# Patient Record
Sex: Male | Born: 2000 | Race: White | Hispanic: No | Marital: Single | Smoking: Current every day smoker
Health system: Southern US, Community
[De-identification: ages and names within clinical notes are randomized; demographics above are authoritative.]

## PROBLEM LIST (undated history)

## (undated) DIAGNOSIS — E079 Disorder of thyroid, unspecified: Secondary | ICD-10-CM

## (undated) DIAGNOSIS — T7840XA Allergy, unspecified, initial encounter: Secondary | ICD-10-CM

## (undated) HISTORY — PX: TONSILLECTOMY: SUR1361

---
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
## 2019-01-15 ENCOUNTER — Other Ambulatory Visit: Payer: Self-pay | Admitting: Pediatrics

## 2019-01-15 DIAGNOSIS — E039 Hypothyroidism, unspecified: Secondary | ICD-10-CM

## 2019-01-19 ENCOUNTER — Telehealth: Payer: Self-pay

## 2019-01-19 NOTE — Telephone Encounter (Signed)
Pre-screening for in-office visit   1. Who is bringing the patient to the visit?   Mother-mom aware of one person policy.   2. Has the person bringing the patient or the patient traveled outside of the state in the past 14 days?   no  3. Has the person bringing the patient or the patient had contact with anyone with suspected or confirmed COVID-19 in the last 14 days?  no   4. Has the person bringing the patient or the patient had any of these symptoms in the last 14 days?   No symptoms reported.   Fever (temp 100.4 F or higher) Difficulty breathing Cough   Made mom aware that masks are required to enter the building. If they do not have one [facility] will provide one for them. 

## 2019-01-20 ENCOUNTER — Ambulatory Visit (INDEPENDENT_AMBULATORY_CARE_PROVIDER_SITE_OTHER): Payer: Medicaid Other

## 2019-01-20 ENCOUNTER — Other Ambulatory Visit: Payer: Self-pay

## 2019-01-20 DIAGNOSIS — E349 Endocrine disorder, unspecified: Secondary | ICD-10-CM

## 2019-01-20 DIAGNOSIS — F640 Transsexualism: Secondary | ICD-10-CM

## 2019-01-20 DIAGNOSIS — Z3042 Encounter for surveillance of injectable contraceptive: Secondary | ICD-10-CM

## 2019-01-20 DIAGNOSIS — IMO0001 Reserved for inherently not codable concepts without codable children: Secondary | ICD-10-CM

## 2019-01-20 MED ORDER — MEDROXYPROGESTERONE ACETATE 150 MG/ML IM SUSP
150.0000 mg | Freq: Once | INTRAMUSCULAR | Status: AC
  Administered 2019-01-20: 150 mg via INTRAMUSCULAR

## 2019-01-20 NOTE — Progress Notes (Signed)
Pt presents for depo injection. Pt within depo window, no urine hcg needed. Injection given, tolerated well. F/u depo injection visit scheduled.   

## 2019-01-31 NOTE — Progress Notes (Signed)
Pt is transgender. Changed diagnosis code to z32.42. please let me know if you have problems with this code.Thanks!

## 2019-02-05 ENCOUNTER — Other Ambulatory Visit: Payer: Self-pay | Admitting: Pediatrics

## 2019-02-05 DIAGNOSIS — E039 Hypothyroidism, unspecified: Secondary | ICD-10-CM

## 2019-02-09 ENCOUNTER — Telehealth: Payer: Self-pay

## 2019-02-09 NOTE — Telephone Encounter (Signed)
Pt called to cancel. Due to college courses- he plans to find out when his fall break is- and schedule onsite nex insertion for that week.

## 2019-02-10 ENCOUNTER — Ambulatory Visit: Payer: Medicaid Other | Admitting: Family

## 2019-03-31 ENCOUNTER — Ambulatory Visit: Payer: Medicaid Other | Admitting: Pediatrics
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
## 2019-06-27 ENCOUNTER — Ambulatory Visit (INDEPENDENT_AMBULATORY_CARE_PROVIDER_SITE_OTHER): Payer: Medicaid Other

## 2019-06-27 ENCOUNTER — Other Ambulatory Visit: Payer: Self-pay

## 2019-06-27 DIAGNOSIS — F640 Transsexualism: Secondary | ICD-10-CM | POA: Diagnosis not present

## 2019-06-27 DIAGNOSIS — Z3202 Encounter for pregnancy test, result negative: Secondary | ICD-10-CM

## 2019-06-27 DIAGNOSIS — Z3042 Encounter for surveillance of injectable contraceptive: Secondary | ICD-10-CM

## 2019-06-27 LAB — POCT URINE PREGNANCY: Preg Test, Ur: NEGATIVE

## 2019-06-27 MED ORDER — MEDROXYPROGESTERONE ACETATE 150 MG/ML IM SUSP
150.0000 mg | Freq: Once | INTRAMUSCULAR | Status: AC
  Administered 2019-06-27: 150 mg via INTRAMUSCULAR

## 2019-06-27 NOTE — Progress Notes (Signed)
Pt presents for depo injection. Pt within depo window, urine hcg needed- urine preg negative. Injection given, tolerated well. F/u depo injection visit scheduled.

## 2019-07-04 ENCOUNTER — Other Ambulatory Visit: Payer: Self-pay | Admitting: Family

## 2019-07-04 DIAGNOSIS — F5101 Primary insomnia: Secondary | ICD-10-CM

## 2019-07-05 MED ORDER — TRAZODONE HCL 100 MG PO TABS: 100.0000 mg | ORAL_TABLET | Freq: Every day | ORAL | 0 refills | Status: DC

## 2019-07-12 ENCOUNTER — Ambulatory Visit: Payer: Medicaid Other | Attending: Internal Medicine

## 2019-07-12 DIAGNOSIS — Z20822 Contact with and (suspected) exposure to covid-19: Secondary | ICD-10-CM

## 2019-07-13 LAB — NOVEL CORONAVIRUS, NAA: SARS-CoV-2, NAA: DETECTED — AB

## 2019-08-04 ENCOUNTER — Encounter: Payer: Self-pay | Admitting: Family

## 2019-09-12 ENCOUNTER — Ambulatory Visit: Payer: Medicaid Other

## 2019-09-19 ENCOUNTER — Ambulatory Visit: Payer: Medicaid Other

## 2019-10-25 ENCOUNTER — Other Ambulatory Visit: Payer: Self-pay | Admitting: Pediatrics

## 2019-10-25 DIAGNOSIS — F640 Transsexualism: Secondary | ICD-10-CM

## 2019-10-26 ENCOUNTER — Encounter: Payer: Self-pay | Admitting: Family

## 2019-11-03 ENCOUNTER — Other Ambulatory Visit (INDEPENDENT_AMBULATORY_CARE_PROVIDER_SITE_OTHER): Payer: Medicaid Other

## 2019-11-03 ENCOUNTER — Other Ambulatory Visit: Payer: Self-pay

## 2019-11-03 DIAGNOSIS — Z3202 Encounter for pregnancy test, result negative: Secondary | ICD-10-CM

## 2019-11-03 DIAGNOSIS — Z3049 Encounter for surveillance of other contraceptives: Secondary | ICD-10-CM | POA: Diagnosis not present

## 2019-11-03 DIAGNOSIS — Z3042 Encounter for surveillance of injectable contraceptive: Secondary | ICD-10-CM

## 2019-11-03 DIAGNOSIS — F640 Transsexualism: Secondary | ICD-10-CM | POA: Diagnosis not present

## 2019-11-03 LAB — POCT URINE PREGNANCY: Preg Test, Ur: NEGATIVE

## 2019-11-03 MED ORDER — MEDROXYPROGESTERONE ACETATE 150 MG/ML IM SUSP
150.0000 mg | Freq: Once | INTRAMUSCULAR | Status: AC
  Administered 2019-11-03: 150 mg via INTRAMUSCULAR

## 2019-11-03 MED ORDER — MEDROXYPROGESTERONE ACETATE 150 MG/ML IM SUSP: 150.0000 mg | Freq: Once | INTRAMUSCULAR | Status: DC

## 2019-11-04 NOTE — Progress Notes (Signed)
Urine preg POC done and Depo given at time of visit.

## 2019-11-12 ENCOUNTER — Encounter: Payer: Self-pay | Admitting: Family

## 2019-11-15 ENCOUNTER — Other Ambulatory Visit: Payer: Self-pay | Admitting: Family

## 2019-11-15 DIAGNOSIS — F640 Transsexualism: Secondary | ICD-10-CM

## 2019-11-15 DIAGNOSIS — Z789 Other specified health status: Secondary | ICD-10-CM

## 2019-11-15 MED ORDER — "SYRINGE 25G X 5/8"" 3 ML MISC": 6 refills | Status: DC

## 2019-11-15 MED ORDER — "BD LUER-LOK SYRINGE 25G X 1-1/2"" 3 ML MISC": 1 refills | Status: DC

## 2019-11-21 ENCOUNTER — Encounter: Payer: Self-pay | Admitting: Family

## 2019-12-01 ENCOUNTER — Ambulatory Visit (INDEPENDENT_AMBULATORY_CARE_PROVIDER_SITE_OTHER): Payer: Medicaid Other | Admitting: Pediatric Endocrinology

## 2019-12-01 ENCOUNTER — Encounter (INDEPENDENT_AMBULATORY_CARE_PROVIDER_SITE_OTHER): Payer: Self-pay

## 2020-06-29 ENCOUNTER — Telehealth: Payer: Medicaid Other | Admitting: Family

## 2020-06-29 ENCOUNTER — Encounter: Payer: Self-pay | Admitting: Family

## 2020-06-29 ENCOUNTER — Other Ambulatory Visit: Payer: Self-pay | Admitting: Family

## 2020-06-29 DIAGNOSIS — Z789 Other specified health status: Secondary | ICD-10-CM

## 2020-07-10 ENCOUNTER — Ambulatory Visit (INDEPENDENT_AMBULATORY_CARE_PROVIDER_SITE_OTHER): Payer: Medicaid Other | Admitting: Pediatrics

## 2020-07-10 ENCOUNTER — Encounter: Payer: Self-pay | Admitting: Pediatrics

## 2020-07-10 VITALS — BP 116/67 | HR 70 | Ht 68.6 in | Wt 284.5 lb

## 2020-07-10 DIAGNOSIS — E039 Hypothyroidism, unspecified: Secondary | ICD-10-CM | POA: Diagnosis not present

## 2020-07-10 DIAGNOSIS — Z789 Other specified health status: Secondary | ICD-10-CM | POA: Diagnosis not present

## 2020-07-10 DIAGNOSIS — F4310 Post-traumatic stress disorder, unspecified: Secondary | ICD-10-CM | POA: Diagnosis not present

## 2020-07-10 MED ORDER — TESTOSTERONE CYPIONATE 200 MG/ML IM SOLN: INTRAMUSCULAR | 0 refills | Status: DC

## 2020-07-10 MED ORDER — "BD LUER-LOK SYRINGE 25G X 1-1/2"" 3 ML MISC": 1 refills | Status: DC

## 2020-07-10 MED ORDER — SERTRALINE HCL 50 MG PO TABS: 50.0000 mg | ORAL_TABLET | Freq: Every day | ORAL | 3 refills | Status: DC

## 2020-07-10 MED ORDER — "SYRINGE 25G X 5/8"" 3 ML MISC": 6 refills | Status: DC

## 2020-07-10 MED ORDER — LEVOTHYROXINE SODIUM 150 MCG PO TABS: 150.0000 ug | ORAL_TABLET | Freq: Every day | ORAL | 2 refills | Status: DC
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
## 2020-07-10 NOTE — Patient Instructions (Signed)
[facility]-  [phone]  Continue testosterone  Restart zolfot 50 mg daily  Restart synthroid 150 mcg daily  Labs today

## 2020-07-11 LAB — COMPREHENSIVE METABOLIC PANEL
AG Ratio: 1.7 (calc) (ref 1.0–2.5)
ALT: 35 U/L (ref 9–46)
AST: 27 U/L (ref 10–40)
Albumin: 4.8 g/dL (ref 3.6–5.1)
Alkaline phosphatase (APISO): 152 U/L — ABNORMAL HIGH (ref 36–130)
BUN: 11 mg/dL (ref 7–25)
CO2: 24 mmol/L (ref 20–32)
Calcium: 10.3 mg/dL (ref 8.6–10.3)
Chloride: 104 mmol/L (ref 98–110)
Creat: 0.9 mg/dL (ref 0.60–1.35)
Globulin: 2.8 g/dL (calc) (ref 1.9–3.7)
Glucose, Bld: 84 mg/dL (ref 65–99)
Potassium: 4.7 mmol/L (ref 3.5–5.3)
Sodium: 140 mmol/L (ref 135–146)
Total Bilirubin: 0.5 mg/dL (ref 0.2–1.2)
Total Protein: 7.6 g/dL (ref 6.1–8.1)

## 2020-07-11 LAB — LIPID PANEL
Cholesterol: 160 mg/dL (ref <200)
HDL: 42 mg/dL (ref >=40)
LDL Cholesterol (Calc): 104 mg/dL (calc) — ABNORMAL HIGH
Non-HDL Cholesterol (Calc): 118 mg/dL (calc) (ref <130)
Total CHOL/HDL Ratio: 3.8 (calc) (ref <5.0)
Triglycerides: 62 mg/dL (ref <150)

## 2020-07-11 LAB — TESTOSTERONE: Testosterone: 186 ng/dL — ABNORMAL LOW (ref 250–827)

## 2020-07-11 LAB — HEMOGLOBIN AND HEMATOCRIT, BLOOD
HCT: 53.5 % — ABNORMAL HIGH (ref 38.5–50.0)
Hemoglobin: 17.6 g/dL — ABNORMAL HIGH (ref 13.2–17.1)

## 2020-07-11 LAB — HEMOGLOBIN A1C
Hgb A1c MFr Bld: 5.2 % of total Hgb (ref <5.7)
Mean Plasma Glucose: 103 mg/dL
eAG (mmol/L): 5.7 mmol/L

## 2020-07-11 LAB — TSH+FREE T4: TSH W/REFLEX TO FT4: 5.53 mIU/L — ABNORMAL HIGH (ref 0.40–4.50)

## 2020-07-11 LAB — T4, FREE: Free T4: 1.3 ng/dL (ref 0.8–1.4)

## 2020-07-31 ENCOUNTER — Other Ambulatory Visit: Payer: Self-pay

## 2020-07-31 ENCOUNTER — Encounter: Payer: Self-pay | Admitting: Pediatrics

## 2020-07-31 ENCOUNTER — Ambulatory Visit (INDEPENDENT_AMBULATORY_CARE_PROVIDER_SITE_OTHER): Payer: Medicaid Other | Admitting: Pediatrics

## 2020-07-31 VITALS — BP 134/82 | HR 86 | Ht 68.6 in | Wt 283.4 lb

## 2020-07-31 DIAGNOSIS — F4310 Post-traumatic stress disorder, unspecified: Secondary | ICD-10-CM

## 2020-07-31 DIAGNOSIS — R030 Elevated blood-pressure reading, without diagnosis of hypertension: Secondary | ICD-10-CM

## 2020-07-31 DIAGNOSIS — E039 Hypothyroidism, unspecified: Secondary | ICD-10-CM

## 2020-07-31 DIAGNOSIS — Z789 Other specified health status: Secondary | ICD-10-CM

## 2020-07-31 NOTE — Progress Notes (Signed)
History was provided by the patient.  Jesse Luna is a 20 y.o. adult who is here for gender dysphoria, hypothyroidism, .  No primary care provider on file.   HPI:  Pt reports that vaginal bleeding stopped about 3 days into synthroid. No concerns today.   Mood is not as bad as it was.   Back on testosterone at previous dosing.   Moved to [place] now which is exciting.   Taking 100 mg of testosterone every week.   No LMP recorded. Patient has had an injection.  Review of Systems  Constitutional: Negative for malaise/fatigue.  HENT: Positive for congestion.   Eyes: Negative for double vision.  Respiratory: Positive for cough. Negative for shortness of breath.   Cardiovascular: Negative for chest pain and palpitations.  Gastrointestinal: Negative for abdominal pain, constipation, diarrhea, nausea and vomiting.  Genitourinary: Negative for dysuria.  Musculoskeletal: Negative for joint pain and myalgias.  Skin: Negative for rash.  Neurological: Negative for dizziness and headaches.  Endo/Heme/Allergies: Does not bruise/bleed easily.  Psychiatric/Behavioral: Negative for depression. The patient is nervous/anxious.     Patient Active Problem List   Diagnosis Date Noted  . Elevated BP without diagnosis of hypertension 07/31/2020  . History of peanut allergy 07/07/2017  . Hypothyroidism 07/07/2017  . 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 Post traumatic stress disorder (PTSD) 06/14/2017    Current Outpatient Medications on File Prior to Visit  Medication Sig Dispense Refill  . BD HYPODERMIC NEEDLE 25G X 1-1/2" MISC USE FOR TESTOSTERONE INJECTION ONCE A WEEK 90 each 11  . levothyroxine (SYNTHROID) 150 MCG tablet Take 1 tablet (150 mcg total) by mouth daily. 90 tablet 2  . sertraline (ZOLOFT) 50 MG tablet Take 1 tablet (50 mg total) by mouth daily. 30 tablet 3  . SYRINGE-NEEDLE, DISP, 3 ML (B-D 3CC LUER-LOK SYR 25GX1/2") 25G X 1-1/2" 3 ML MISC USE FOR TESTOSTERONE  INJECTION ONCE A WEEK 30 each 1  . Syringe/Needle, Disp, (SYRINGE 3CC/25GX5/8") 25G X 5/8" 3 ML MISC Use once a week for testosterone injections 100 each 6  . testosterone cypionate (DEPOTESTOSTERONE CYPIONATE) 200 MG/ML injection INJECT 100 MG (0.5 ML) INTO THE SUBCUTANEOUS TISSUE EVERY 7 DAYS. 10 mL 0   No current facility-administered medications on file prior to visit.    Allergies  Allergen Reactions  . Peanut-Containing Drug Products Anaphylaxis    Physical Exam:    Vitals:   07/31/20 1111 07/31/20 1113 07/31/20 1148  BP: (!) 140/96 (!) 138/95 134/82  Pulse: 86 (!) 101 86  Weight: 283 lb 6.4 oz (128.5 kg)    Height: 5' 8.6" (1.742 m)      Growth percentile SmartLinks can only be used for patients less than 49 years old.  Physical Exam Constitutional:      Appearance: He is well-developed and well-nourished.  HENT:     Head: Normocephalic.  Neck:     Thyroid: No thyromegaly.  Cardiovascular:     Rate and Rhythm: Normal rate and regular rhythm.     Pulses: Intact distal pulses.     Heart sounds: Normal heart sounds.  Pulmonary:     Effort: Pulmonary effort is normal.     Breath sounds: Normal breath sounds.  Abdominal:     General: Bowel sounds are normal.     Palpations: Abdomen is soft.     Tenderness: There is no abdominal tenderness.  Musculoskeletal:        General: Normal range of motion.  Skin:    General:  Skin is warm and dry.  Neurological:     Mental Status: He is alert and oriented to person, place, and time.  Psychiatric:        Mood and Affect: Mood and affect normal.     Assessment/Plan: 1. Male-to-male transgender person Will repeat testosterone today now that he is back on the 100 mg. H&H were elevated on last check, so want to ensure his T level is not too high. Based solely on clinical observation, I suspect we may need to decrease his dose.  - Testosterone  2. Post traumatic stress disorder (PTSD) Stable with restart of sertraline.    3. Hypothyroidism, unspecified type Repeat TSH today- he has been back on maintenance dose for 3 weeks.  - TSH  4. Elevated BP without diagnosis of hypertension Improved prior to leaving clinic today. He would benefit from lifestyle changes. He was going to job interview in [place] after our visit which would have him doing more physical activity. Will continue to monitor for now, needs PCP.   Return in 6 weeks or sooner as needed.   [name], FNP

## 2020-08-01 LAB — TESTOSTERONE: Testosterone: 357 ng/dL (ref 250–827)

## 2020-08-01 LAB — TSH: TSH: 1.1 mIU/L (ref 0.40–4.50)

## 2020-09-11 ENCOUNTER — Ambulatory Visit: Payer: Medicaid Other | Admitting: Pediatrics

## 2020-10-31 ENCOUNTER — Other Ambulatory Visit: Payer: Self-pay | Admitting: Pediatrics

## 2020-10-31 DIAGNOSIS — F4310 Post-traumatic stress disorder, unspecified: Secondary | ICD-10-CM

## 2020-11-16 ENCOUNTER — Other Ambulatory Visit: Payer: Self-pay

## 2020-11-16 ENCOUNTER — Emergency Department (HOSPITAL_BASED_OUTPATIENT_CLINIC_OR_DEPARTMENT_OTHER)
Admission: EM | Admit: 2020-11-16 | Discharge: 2020-11-16 | Disposition: A | Discharge status: Home / Self Care | Payer: Worker's Compensation | Attending: Emergency Medicine | Admitting: Emergency Medicine

## 2020-11-16 ENCOUNTER — Encounter (HOSPITAL_BASED_OUTPATIENT_CLINIC_OR_DEPARTMENT_OTHER): Payer: Self-pay

## 2020-11-16 DIAGNOSIS — W228XXA Striking against or struck by other objects, initial encounter: Secondary | ICD-10-CM | POA: Insufficient documentation

## 2020-11-16 DIAGNOSIS — S0502XA Injury of conjunctiva and corneal abrasion without foreign body, left eye, initial encounter: Secondary | ICD-10-CM | POA: Diagnosis not present

## 2020-11-16 DIAGNOSIS — S0592XA Unspecified injury of left eye and orbit, initial encounter: Secondary | ICD-10-CM | POA: Diagnosis present

## 2020-11-16 DIAGNOSIS — Z23 Encounter for immunization: Secondary | ICD-10-CM | POA: Insufficient documentation

## 2020-11-16 DIAGNOSIS — F17290 Nicotine dependence, other tobacco product, uncomplicated: Secondary | ICD-10-CM | POA: Insufficient documentation

## 2020-11-16 DIAGNOSIS — Z91010 Allergy to peanuts: Secondary | ICD-10-CM | POA: Diagnosis not present

## 2020-11-16 DIAGNOSIS — Y990 Civilian activity done for income or pay: Secondary | ICD-10-CM | POA: Insufficient documentation

## 2020-11-16 DIAGNOSIS — Z79899 Other long term (current) drug therapy: Secondary | ICD-10-CM | POA: Insufficient documentation

## 2020-11-16 DIAGNOSIS — E039 Hypothyroidism, unspecified: Secondary | ICD-10-CM | POA: Diagnosis not present

## 2020-11-16 MED ORDER — TETRACAINE HCL 0.5 % OP SOLN
2.0000 [drp] | Freq: Once | OPHTHALMIC | Status: AC
  Administered 2020-11-16: 2 [drp] via OPHTHALMIC
  Filled 2020-11-16: qty 4

## 2020-11-16 MED ORDER — ERYTHROMYCIN 5 MG/GM OP OINT: TOPICAL_OINTMENT | OPHTHALMIC | 0 refills | Status: DC

## 2020-11-16 MED ORDER — OXYCODONE-ACETAMINOPHEN 5-325 MG PO TABS: 1.0000 | ORAL_TABLET | Freq: Every evening | ORAL | 0 refills | Status: DC | PRN

## 2020-11-16 MED ORDER — TETANUS-DIPHTH-ACELL PERTUSSIS 5-2.5-18.5 LF-MCG/0.5 IM SUSY
0.5000 mL | PREFILLED_SYRINGE | Freq: Once | INTRAMUSCULAR | Status: AC
  Administered 2020-11-16: 0.5 mL via INTRAMUSCULAR
  Filled 2020-11-16: qty 0.5

## 2020-11-16 MED ORDER — FLUORESCEIN SODIUM 1 MG OP STRP
2.0000 | ORAL_STRIP | Freq: Once | OPHTHALMIC | Status: AC
  Administered 2020-11-16: 2 via OPHTHALMIC
  Filled 2020-11-16: qty 2

## 2020-11-16 MED ORDER — ERYTHROMYCIN 5 MG/GM OP OINT
TOPICAL_OINTMENT | Freq: Once | OPHTHALMIC | Status: AC
  Administered 2020-11-16: 1 via OPHTHALMIC
  Filled 2020-11-16: qty 3.5

## 2020-11-16 NOTE — ED Triage Notes (Addendum)
Pt reports ?FB left eye while cutting plywood ~5 hours PTA-states eye has been flushed-NAD-steady gait

## 2020-11-16 NOTE — Discharge Instructions (Signed)
To use your eye ointment please place half an inch in the left lower eyelid 3 times a day for 5 days. I have given you a paper prescription for the same medicine that you got here. You only need to get this filled if you use up the medicine you got here and are not done with your course of antibiotics.  Please take Ibuprofen (Advil, motrin) and Tylenol (acetaminophen) to relieve your pain.    You may take up to 600 MG (3 pills) of normal strength ibuprofen every 8 hours as needed.   You make take tylenol, up to 1,000 mg (two extra strength pills) every 8 hours as needed.   It is safe to take ibuprofen and tylenol at the same time as they work differently.   Do not take more than 3,000 mg tylenol in a 24 hour period (not more than one dose every 8 hours.  Please check all medication labels as many medications such as pain and cold medications may contain tylenol.  Do not drink alcohol while taking these medications.  Do not take other NSAID'S while taking ibuprofen (such as aleve or naproxen).  Please take ibuprofen with food to decrease stomach upset.  You are being prescribed a medication which may make you sleepy. For 24 hours after one dose please do not drive, operate heavy machinery, care for a small child with out another adult present, or perform any activities that may cause harm to you or someone else if you were to fall asleep or be impaired.

## 2020-11-16 NOTE — ED Provider Notes (Signed)
[facility] EMERGENCY DEPARTMENT Provider Note   CSN: [id number] Arrival date & time: 11/16/20  1815     History Chief Complaint  Patient presents with  . Foreign Body in Jesse Luna is a 20 y.o. adult who presents today for concern of left eye foreign body. He states that at about 10 AM this morning, by his gas, he was cutting plywood at work using a Paramedic saw.  He states that a piece of the plywood flew up over his glasses and went into his left eye.  He irrigated the eye prior to arrival.  His pain is been worsening.  He denies any other injuries.  He does not know when his last tetanus shot was, chart review shows that it was an January 2012.    HPI     Past Medical History:  Diagnosis Date  . Allergy   . Thyroid disease     Patient Active Problem List   Diagnosis Date Noted  . Elevated BP without diagnosis of hypertension 07/31/2020  . History of peanut allergy 07/07/2017  . Hypothyroidism 07/07/2017  . 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 Post traumatic stress disorder (PTSD) 06/14/2017    Past Surgical History:  Procedure Laterality Date  . TONSILLECTOMY       OB History   No obstetric history on file.     Family History  Problem Relation Age of Onset  . Alcohol abuse Mother   . Alcohol abuse Father     Social History   Tobacco Use  . Smoking status: Current Every Day Smoker    Types: E-cigarettes  . Smokeless tobacco: Never Used  Vaping Use  . Vaping Use: Every day  Substance Use Topics  . Alcohol use: Yes    Comment: occ  . Drug use: Yes    Types: Marijuana    Home Medications Prior to Admission medications   Medication Sig Start Date End Date Taking? Authorizing Provider  erythromycin ophthalmic ointment Place a 1/2 inch ribbon of ointment into the left lower eyelid three times a day for 5 days 11/16/20  Yes Cristina Gong, PA-C  oxyCODONE-acetaminophen (PERCOCET/ROXICET) 5-325 MG  tablet Take 1 tablet by mouth at bedtime as needed for severe pain. 11/16/20  Yes Cristina Gong, PA-C  BD HYPODERMIC NEEDLE 25G X 1-1/2" MISC USE FOR TESTOSTERONE INJECTION ONCE A WEEK 10/03/18   [name] T, FNP  levothyroxine (SYNTHROID) 150 MCG tablet Take 1 tablet (150 mcg total) by mouth daily. 07/10/20   Verneda Skill, FNP  sertraline (ZOLOFT) 50 MG tablet TAKE 1 TABLET BY MOUTH EVERY DAY 10/31/20   [name] T, FNP  SYRINGE-NEEDLE, DISP, 3 ML (B-D 3CC LUER-LOK SYR 25GX1/2") 25G X 1-1/2" 3 ML MISC USE FOR TESTOSTERONE INJECTION ONCE A WEEK 07/10/20   [name] T, FNP  Syringe/Needle, Disp, (SYRINGE 3CC/25GX5/8") 25G X 5/8" 3 ML MISC Use once a week for testosterone injections 07/10/20   [name] T, FNP  testosterone cypionate (DEPOTESTOSTERONE CYPIONATE) 200 MG/ML injection INJECT 100 MG (0.5 ML) INTO THE SUBCUTANEOUS TISSUE EVERY 7 DAYS. 07/10/20   Verneda Skill, FNP    Allergies    Peanut-containing drug products  Review of Systems   Review of Systems  Constitutional: Negative for chills and fever.  Eyes: Positive for photophobia, pain, discharge, redness and visual disturbance.       All left eye only  Cardiovascular: Negative for chest pain.  Gastrointestinal: Negative for  abdominal pain.  Neurological: Negative for weakness and headaches.  All other systems reviewed and are negative.   Physical Exam Updated Vital Signs BP 129/79 (BP Location: Left Arm)   Pulse 70   Temp 98.4 F (36.9 C) (Oral)   Resp 18   Ht 5\' 9"  (1.753 m)   Wt 125.2 kg   SpO2 98%   BMI 40.76 kg/m   Physical Exam Vitals and nursing note reviewed.  Constitutional:      General: He is not in acute distress.    Appearance: He is not ill-appearing.  HENT:     Head: Normocephalic.  Eyes:     General: Lids are everted, no foreign bodies appreciated. Gaze aligned appropriately. No visual field deficit.       Right eye: No foreign body.        Left eye: No  foreign body.     Extraocular Movements: Extraocular movements intact.     Conjunctiva/sclera:     Right eye: Right conjunctiva is not injected. No chemosis, exudate or hemorrhage.    Left eye: Left conjunctiva is injected. No chemosis, exudate or hemorrhage.    Pupils: Pupils are equal, round, and reactive to light.     Left eye: Corneal abrasion and fluorescein uptake present. Seidel exam negative.    Slit lamp exam:    Left eye: No hyphema or hypopyon.  Cardiovascular:     Rate and Rhythm: Normal rate.  Pulmonary:     Effort: Pulmonary effort is normal. No respiratory distress.  Neurological:     Mental Status: He is alert.     ED Results / Procedures / Treatments   Labs (all labs ordered are listed, but only abnormal results are displayed) Labs Reviewed - No data to display  EKG None  Radiology No results found.  Procedures Procedures   Medications Ordered in ED Medications  fluorescein ophthalmic strip 2 strip (2 strips Both Eyes Given 11/16/20 1848)  tetracaine (PONTOCAINE) 0.5 % ophthalmic solution 2 drop (2 drops Both Eyes Given 11/16/20 1847)  Tdap (BOOSTRIX) injection 0.5 mL (0.5 mLs Intramuscular Given 11/16/20 2005)  erythromycin ophthalmic ointment (1 application Left Eye Given 11/16/20 2009)    ED Course  I have reviewed the triage vital signs and the nursing notes.  Pertinent labs & imaging results that were available during my care of the patient were reviewed by me and considered in my medical decision making (see chart for details).  Clinical Course as of 11/17/20 0046  Fri Nov 16, 2020  2006 2007 I spoke with Dr. [id number], ophthalmology who requested patient be given erythromycin eye ointment, and call the office Monday morning. [EH]    Clinical Course User Index [EH] Wednesday   MDM Rules/Calculators/A&P                         By patient is a 20 year old who presents today for evaluation of a left eye injury after he was cutting  plywood at work and reportedly got plywood into his left eye.  He irrigated it prior to arrival. Lids are flipped and everted without visualized retained foreign body.  Eye was irrigated prior to arrival. Tetanus is updated. Left eye has fluorescein uptake, at 6:00 approximately 1 cm circular area outside of the iris. Vision is grossly intact. His pain was fully alleviated with topical Pontocaine drops consistent with corneal abrasion. I spoke with ophthalmology on-call who requested patient be given erythromycin eye  ointment, call the office Monday morning. Patient is given erythromycin eye ointment here along with prescription as needed for refill if he runs out. [facility] PMP is consulted, push prescription for 3 pills of Percocet is given for patient to take at night as needed for pain uncontrolled by ibuprofen and Tylenol over-the-counter. He is instructed on safe use of both of these.  Return precautions were discussed with patient who states their understanding.  At the time of discharge patient denied any unaddressed complaints or concerns.  Patient is agreeable for discharge home.  Note: Portions of this report may have been transcribed using voice recognition software. Every effort was made to ensure accuracy; however, inadvertent computerized transcription errors may be present Final Clinical Impression(s) / ED Diagnoses Final diagnoses:  Abrasion of left cornea, initial encounter    Rx / DC Orders ED Discharge Orders         Ordered    oxyCODONE-acetaminophen (PERCOCET/ROXICET) 5-325 MG tablet  At bedtime PRN        11/16/20 2014    erythromycin ophthalmic ointment        11/16/20 2015           Cristina Gong, PA-C 11/17/20 0046    Virgina Norfolk, DO 11/17/20 1824

## 2020-11-20 ENCOUNTER — Other Ambulatory Visit: Payer: Self-pay | Admitting: Pediatrics

## 2020-11-20 ENCOUNTER — Telehealth: Payer: Self-pay

## 2020-11-20 DIAGNOSIS — S0590XA Unspecified injury of unspecified eye and orbit, initial encounter: Secondary | ICD-10-CM

## 2020-11-20 NOTE — Telephone Encounter (Signed)
[facility] office called requesting a referral. Patient was seen today and a referral is needed for insurance purposes. Fax: [phone].

## 2020-11-20 NOTE — Telephone Encounter (Signed)
Done if you can fax referral... thanks!

## 2020-11-20 NOTE — Telephone Encounter (Signed)
Thanks referral has been faxed

## 2020-12-31 ENCOUNTER — Other Ambulatory Visit: Payer: Self-pay | Admitting: Pediatrics

## 2020-12-31 DIAGNOSIS — Z789 Other specified health status: Secondary | ICD-10-CM

## 2021-01-02 ENCOUNTER — Encounter: Payer: Self-pay | Admitting: Family

## 2021-01-02 NOTE — Progress Notes (Signed)
Patient not seen. Closed for admin purposes.  

## 2021-02-13 ENCOUNTER — Encounter: Payer: Self-pay | Admitting: Family

## 2021-02-20 ENCOUNTER — Ambulatory Visit (INDEPENDENT_AMBULATORY_CARE_PROVIDER_SITE_OTHER): Payer: Medicaid Other | Admitting: Pediatrics

## 2021-02-20 VITALS — BP 125/86 | HR 95 | Ht 68.27 in | Wt 244.4 lb

## 2021-02-20 DIAGNOSIS — F332 Major depressive disorder, recurrent severe without psychotic features: Secondary | ICD-10-CM

## 2021-02-20 DIAGNOSIS — E039 Hypothyroidism, unspecified: Secondary | ICD-10-CM

## 2021-02-20 DIAGNOSIS — Z789 Other specified health status: Secondary | ICD-10-CM

## 2021-02-20 DIAGNOSIS — R634 Abnormal weight loss: Secondary | ICD-10-CM

## 2021-02-20 DIAGNOSIS — F4310 Post-traumatic stress disorder, unspecified: Secondary | ICD-10-CM

## 2021-02-20 MED ORDER — TESTOSTERONE CYPIONATE 200 MG/ML IM SOLN: INTRAMUSCULAR | 4 refills | Status: DC

## 2021-02-20 MED ORDER — MIRTAZAPINE 15 MG PO TABS: ORAL_TABLET | ORAL | 1 refills | Status: DC

## 2021-02-20 NOTE — Progress Notes (Signed)
History was provided by the patient.  Jesse Luna is a 20 y.o. adult who is here for gender dysphoria, hypothyroidism, .  Pcp, No   HPI:    Mood has been "not good" since he and ex broke up July 16. Was working and going to gym to stay busy. Prior to break up, stopped Zoloft since he felt it was in effective, then broke up shortly after when his mood acutely worsened. Mother's friend who is an adult psychiatrist recommended lithium. Still weekly therapy, at [facility] [place]. This has been helpful. Endorses SI, no intention to complete suicide today, reports he "always has a plan" since he was 12. Yesterday felt "high" as if he was manic for 1 hour.  After break up has what he felt was manic episode for 3-4 days--but cannot describe further, was able to sleep during that episode.   Taking testosterone 100 mg subq weekly, out for 3 weeks, foster dad provide 100 weekly from his supply. He like effects of testosterone.   No bleeding. Not taking synthroid.  Sill living in [place], visit foster family in [place].   No LMP recorded. (Menstrual status: Other).  Review of Systems  Constitutional:  Negative for malaise/fatigue.  HENT:  Negative for congestion.   Eyes:  Negative for double vision.  Respiratory:  Negative for cough and shortness of breath.   Cardiovascular:  Negative for chest pain and palpitations.  Gastrointestinal:  Negative for abdominal pain, constipation, diarrhea, nausea and vomiting.  Genitourinary:  Negative for dysuria.  Musculoskeletal:  Negative for joint pain and myalgias.  Skin:  Negative for rash.  Neurological:  Negative for dizziness and headaches.  Endo/Heme/Allergies:  Does not bruise/bleed easily.  Psychiatric/Behavioral:  Positive for depression. The patient is not nervous/anxious.    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 90 tablet 2   oxyCODONE-acetaminophen (PERCOCET/ROXICET) 5-325 MG tablet Take 1 tablet by mouth at bedtime as needed for severe pain. 3 tablet 0   sertraline (ZOLOFT) 50 MG tablet TAKE 1 TABLET BY MOUTH EVERY DAY 30 tablet 3   SYRINGE-NEEDLE, DISP, 3 ML (B-D 3CC LUER-LOK SYR 25GX1/2") 25G X 1-1/2" 3 ML MISC USE FOR TESTOSTERONE INJECTION ONCE A WEEK 30 each 1   Syringe/Needle, Disp, (SYRINGE 3CC/25GX5/8") 25G X 5/8" 3 ML MISC Use once a week for testosterone injections 100 each 6   testosterone cypionate (DEPOTESTOSTERONE CYPIONATE) 200 MG/ML injection INJECT 100 MG (0.5 ML) INTO THE SUBCUTANEOUS TISSUE EVERY 7 DAYS. 2 mL 4   No current facility-administered medications on file prior to visit.    Allergies  Allergen Reactions   Peanut-Containing Drug Products Anaphylaxis    Physical Exam:    Vitals:   02/20/21 1419  BP: 125/86  Pulse: 95  Weight: 244 lb 6.4 oz (110.9 kg)  Height: 5'  8.27" (1.734 m)    Growth percentile SmartLinks can only be used for patients less than 77 years old.  Physical Exam Constitutional:      General: He is not in acute distress.    Appearance: He is well-developed.  HENT:     Head: Normocephalic.     Nose: Nose normal. No congestion.     Mouth/Throat:     Mouth: Mucous membranes are moist.  Eyes:     General: No scleral icterus.    Pupils: Pupils are equal, round, and reactive to light.  Neck:     Thyroid: No  thyromegaly.  Cardiovascular:     Rate and Rhythm: Normal rate.     Pulses: Normal pulses.     Heart sounds: Normal heart sounds. No murmur heard. Pulmonary:     Effort: Pulmonary effort is normal.     Breath sounds: Normal breath sounds.  Abdominal:     General: Bowel sounds are normal. There is no distension.     Palpations: Abdomen is soft.     Tenderness: There is no abdominal tenderness.  Musculoskeletal:        General: Normal range of motion.     Cervical back: Neck supple.  Lymphadenopathy:     Cervical: No cervical adenopathy.  Skin:    General: Skin is warm and dry.     Findings: Bruising present.  Neurological:     Mental Status: He is alert.  Psychiatric:     Comments: Flat affect    Assessment/Plan:  Jesse Luna is a transgender male to male here today for follow up of the below issues.  1. Hypothyroidism, unspecified type - Not taking synthroid, will get labs today - if hypothyroid, this could be contributing to mood  - TSH + free T4  2. Transgender 3. Male-to-male transgender person - Refill today, can obtain labs at next visit if taking consistently  - testosterone cypionate (DEPOTESTOSTERONE CYPIONATE) 200 MG/ML injection; Inject 100 mg (0.5 ml) into the subcutaneous tissue once weekly  Dispense: 2 mL; Refill: 4  4. Post traumatic stress disorder (PTSD) - Therapy weekly at [facility], encouraged to continue  - He reports possible diagnosis of bipolar and interest in lithium, which is beyond our scope   - Ambulatory referral to Psychiatry  5. Weight loss of more than 10% body weight - Amylase - CBC With Differential - Comprehensive metabolic panel - Ferritin - Lipase - Magnesium - Phosphorus - Sedimentation rate - VITAMIN D 25 Hydroxy (Vit-D Deficiency, Fractures) - mirtazapine (REMERON) 15 MG tablet; Take 0.5 tablets (7.5 mg total) by mouth at bedtime for 7 days, THEN 1 tablet (15 mg total) at bedtime for 23 days.  Dispense: 30 tablet;  Refill: 1  6. Severe episode of recurrent major depressive disorder, without psychotic features (HCC) - Therapy weekly at [facility], encouraged to continue  - Acute mood worsening 2/2 breakup, trouble sleeping and eating, start mirtazapine to address these  - mirtazapine (REMERON) 15 MG tablet; Take 0.5 tablets (7.5 mg total) by mouth at bedtime for 7 days, THEN 1 tablet (15 mg total) at bedtime for 23 days.  Dispense: 30 tablet; Refill: 1  PHQ-SADS Last 3 Score only 02/20/2021 07/11/2020 09/01/2018  PHQ-15 Score 6 9 6   Total GAD-7 Score 7 11 7   PHQ-9 Total Score 12 6 4      Return in 2 weeks or sooner as needed.   , MD PGY-3 [facility] Pediatric[facility], Primary Care

## 2021-02-20 NOTE — Patient Instructions (Addendum)
Referral to psychiatry today for further assessment of mood and medications  Labs today for thyroid  Continue testosterone- you should be using only 1 vial every 3-4 weeks (0.5 ml on syringe)

## 2021-02-20 NOTE — Progress Notes (Signed)
I have reviewed the resident's note and plan of care and helped develop the plan as necessary.  Noted that patient has lost about 30 pounds in three months. On further discussion he endorses a loss of appetite that started prior to his break up. Since his break up 52 days ago he has eaten very little each day. Most days consist of one meal that is very light. Other days he won't eat at all. He says he feels ok, though has some dizziness at times. He also lost his job on Friday, so this is an added stressor. He goes to his foster family's house when he is really down. Their 53 yo daughter keeps him from taking his life as he wants to be there for her. He is in therapy once weekly and has crisis resources.   He is open to starting mirtazapine today and to a psychiatry referral as a dx of bipolar d/o has been suggested to him in the past.   We discussed the significant effects of malnutrition on the body and brain. Encouraged him to work hard to eat more in the coming days. Will follow up in 2 weeks. Labs today to assess effects of malnutrition. He has also not been taking synthroid in quite some time- expect he will need to restart but labs today first.  [name], FNP

## 2021-02-21 LAB — COMPREHENSIVE METABOLIC PANEL
AG Ratio: 1.8 (calc) (ref 1.0–2.5)
ALT: 23 U/L (ref 9–46)
AST: 23 U/L (ref 10–40)
Albumin: 5.2 g/dL — ABNORMAL HIGH (ref 3.6–5.1)
Alkaline phosphatase (APISO): 163 U/L — ABNORMAL HIGH (ref 36–130)
BUN: 13 mg/dL (ref 7–25)
CO2: 27 mmol/L (ref 20–32)
Calcium: 10.6 mg/dL — ABNORMAL HIGH (ref 8.6–10.3)
Chloride: 102 mmol/L (ref 98–110)
Creat: 0.97 mg/dL (ref 0.60–1.24)
Globulin: 2.9 g/dL (calc) (ref 1.9–3.7)
Glucose, Bld: 78 mg/dL (ref 65–99)
Potassium: 4.4 mmol/L (ref 3.5–5.3)
Sodium: 139 mmol/L (ref 135–146)
Total Bilirubin: 0.8 mg/dL (ref 0.2–1.2)
Total Protein: 8.1 g/dL (ref 6.1–8.1)

## 2021-02-21 LAB — AMYLASE: Amylase: 22 U/L (ref 21–101)

## 2021-02-21 LAB — SEDIMENTATION RATE: Sed Rate: 2 mm/h (ref 0–15)

## 2021-02-21 LAB — VITAMIN D 25 HYDROXY (VIT D DEFICIENCY, FRACTURES): Vit D, 25-Hydroxy: 34 ng/mL (ref 30–100)

## 2021-02-21 LAB — PHOSPHORUS: Phosphorus: 3.5 mg/dL (ref 2.5–4.5)

## 2021-02-21 LAB — TSH+FREE T4: TSH W/REFLEX TO FT4: 2.34 mIU/L (ref 0.40–4.50)

## 2021-02-21 LAB — MAGNESIUM: Magnesium: 2.1 mg/dL (ref 1.5–2.5)

## 2021-02-21 LAB — LIPASE: Lipase: 13 U/L (ref 7–60)

## 2021-02-21 LAB — FERRITIN: Ferritin: 70 ng/mL (ref 38–380)

## 2021-03-02 ENCOUNTER — Ambulatory Visit: Payer: Medicaid Other | Admitting: Pediatrics

## 2021-03-03 ENCOUNTER — Ambulatory Visit (HOSPITAL_COMMUNITY)
Admission: EM | Admit: 2021-03-03 | Discharge: 2021-03-03 | Disposition: A | Discharge status: Home / Self Care | Payer: Medicaid Other | Attending: Psychiatry | Admitting: Psychiatry

## 2021-03-03 ENCOUNTER — Other Ambulatory Visit: Payer: Self-pay

## 2021-03-03 DIAGNOSIS — R45851 Suicidal ideations: Secondary | ICD-10-CM | POA: Insufficient documentation

## 2021-03-03 DIAGNOSIS — Z79899 Other long term (current) drug therapy: Secondary | ICD-10-CM | POA: Insufficient documentation

## 2021-03-03 DIAGNOSIS — F331 Major depressive disorder, recurrent, moderate: Secondary | ICD-10-CM | POA: Insufficient documentation

## 2021-03-03 NOTE — ED Notes (Signed)
Discharge instructions provided and Pt stated understanding. Pt alert, orient and ambulatory prior to d/c from facility. No personal belongings to be returned to pt. Pt escorted to the front lobby to go home with family/friends. Safety maintained.

## 2021-03-03 NOTE — ED Provider Notes (Signed)
Behavioral Health Urgent Care Medical Screening Exam  Patient Name: Jesse Luna MRN: [id number] Date of Evaluation: 03/03/21 Chief Complaint:   Diagnosis:  Final diagnoses:  MDD (major depressive disorder), recurrent episode, moderate (HCC)    History of Present illness: Jesse Luna is a 20 y.o. adult presents accompanied to [facility] urgent care accompanied by his peers support counselor.  He reports feeling worsening depression states chronic suicidal ideations.  Patient did not elaborate with plan.  He denies intent.  He reports he recently started seeing a therapist and a psychiatrist [facility].  States he was recently started on lithium, Prolixin and clonidine.  He reports taking and tolerating medications well.  States he started using cocaine 3 days ago.  States he does not feel as if he is addicted to cocaine.  States his counselor and foster family advised him to follow-up for additional services.  Mackenzie adamantly denying suicidal ideations during this assessment.  Stated " I don't feel nothing." Reported he abruptly stopped taken Zoloft and is taken current medications as directed.  He reports he is going to stay with his foster family for the next few nights.  Discussed follow-up with partial hospitalization programming.  Patient was receptive to plan.  Support, encouragement and reassurance was provided.  During evaluation Jesse Luna is sitting  in no acute distress.  He  is alert/oriented x 4; calm/cooperative; and mood congruent with affect. He is speaking in a clear tone at moderate volume, and normal pace; with good eye contact.  His thought process is coherent and relevant; There is no indication that he is currently responding to internal/external stimuli or experiencing delusional thought content; and he has denied suicidal/self-harm/homicidal ideation, psychosis, and paranoia.   Patient has remained calm throughout assessment and  has answered questions appropriately.    At this time Jesse Luna is educated and verbalizes understanding of mental health resources and other crisis services in the community. He is instructed to call 911 and present to the nearest emergency room should he experience any suicidal/homicidal ideation, auditory/visual/hallucinations, or detrimental worsening of his mental health condition.    Psychiatric Specialty Exam  Presentation  General Appearance:Appropriate for Environment  Eye Contact:Good  Speech:Clear and Coherent  Speech Volume:Normal  Handedness:Right   Mood and Affect  Mood:Depressed  Affect:Congruent   Thought Process  Thought Processes:Coherent  Descriptions of Associations:Intact  Orientation:Full (Time, Place and Person)  Thought Content:Logical    Hallucinations:None  Ideas of Reference:None  Suicidal Thoughts:No  Homicidal Thoughts:No   Sensorium  Memory:Immediate Good; Recent Good; Remote Good  Judgment:Fair  Insight:Fair   Executive Functions  Concentration:Fair  Attention Span:Fair  Recall:Fair  Fund of Knowledge:Good  Language:Good   Psychomotor Activity  Psychomotor Activity:Normal   Assets  Assets:Intimacy   Sleep  Sleep:Fair  Number of hours:  No data recorded  Nutritional Assessment (For OBS and FBC admissions only) Has the patient had a weight loss or gain of 10 pounds or more in the last 3 months?: No Has the patient had a decrease in food intake/or appetite?: No Does the patient have dental problems?: No Does the patient have eating habits or behaviors that may be indicators of an eating disorder including binging or inducing vomiting?: No Has the patient recently lost weight without trying?: 0 Has the patient been eating poorly because of a decreased appetite?: 0 Malnutrition Screening Tool Score: 0   Physical Exam: Physical Exam Vitals and nursing note reviewed.  HENT:  Head:  Normocephalic.  Cardiovascular:     Rate and Rhythm: Normal rate and regular rhythm.  Pulmonary:     Effort: Pulmonary effort is normal.     Breath sounds: Normal breath sounds.  Neurological:     Mental Status: He is alert.  Psychiatric:        Attention and Perception: Attention normal.        Mood and Affect: Mood normal.        Speech: Speech normal.        Behavior: Behavior normal.        Thought Content: Thought content normal.        Cognition and Memory: Cognition normal.   Review of Systems  HENT: Negative.    Eyes: Negative.   Cardiovascular: Negative.   Gastrointestinal: Negative.   Psychiatric/Behavioral:  Positive for depression, substance abuse and suicidal ideas. The patient is nervous/anxious.   All other systems reviewed and are negative. Blood pressure (!) 146/93, pulse 100, temperature 98.1 F (36.7 C), temperature source Oral, resp. rate 18, SpO2 98 %. There is no height or weight on file to calculate BMI.  Musculoskeletal: Strength & Muscle Tone: within normal limits Gait & Station: normal Patient leans: N/A   [facility] MSE Discharge Disposition for Follow up and Recommendations: Based on my evaluation the patient does not appear to have an emergency medical condition and can be discharged with resources and follow up care in outpatient services for Medication Management and Group Therapy   Take all medications as prescribed. Keep all follow-up appointments as scheduled.  Do not consume alcohol or use illegal drugs while on prescription medications. Report any adverse effects from your medications to your primary care provider promptly.  In the event of recurrent symptoms or worsening symptoms, call 911, a crisis hotline, or go to the nearest emergency department for evaluation.    Oneta Rack, NP 03/03/2021, 5:21 PM

## 2021-03-03 NOTE — Discharge Instructions (Signed)
Take all medications as prescribed. Keep all follow-up appointments as scheduled.  Do not consume alcohol or use illegal drugs while on prescription medications. Report any adverse effects from your medications to your primary care provider promptly.  In the event of recurrent symptoms or worsening symptoms, call 911, a crisis hotline, or go to the nearest emergency department for evaluation.   

## 2021-03-12 ENCOUNTER — Ambulatory Visit (INDEPENDENT_AMBULATORY_CARE_PROVIDER_SITE_OTHER): Payer: Medicaid Other | Admitting: Pediatrics

## 2021-03-12 ENCOUNTER — Other Ambulatory Visit: Payer: Self-pay

## 2021-03-12 VITALS — BP 129/79 | HR 85 | Ht 68.31 in | Wt 244.8 lb

## 2021-03-12 DIAGNOSIS — F1990 Other psychoactive substance use, unspecified, uncomplicated: Secondary | ICD-10-CM | POA: Insufficient documentation

## 2021-03-12 DIAGNOSIS — F332 Major depressive disorder, recurrent severe without psychotic features: Secondary | ICD-10-CM

## 2021-03-12 DIAGNOSIS — R634 Abnormal weight loss: Secondary | ICD-10-CM

## 2021-03-12 DIAGNOSIS — Z789 Other specified health status: Secondary | ICD-10-CM

## 2021-03-12 DIAGNOSIS — F4310 Post-traumatic stress disorder, unspecified: Secondary | ICD-10-CM | POA: Diagnosis not present

## 2021-03-12 NOTE — Patient Instructions (Signed)
Come back in 2 weeks and check in  We will refer you to intensive outpatient therapy for substances  Let us know if you need [place]

## 2021-03-12 NOTE — Progress Notes (Addendum)
History was provided by the patient.  Jesse Luna is a 20 y.o. adult who is here for MDD, PTSD, trans care, weight loss.  Pcp, No   HPI:  Pt reports he is taking his lithium, prazosin and clonidine daily. He didn't sleep for three nights but then did sleep well last night. 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 Staying at his apartment right now. Used cocaine for 3 days prior to [facility] visit. Was not using again until Sunday night when he bought some more and used it all. Has episodes when driving of zoning out and then going too fast- says this is not intentional.   Has always had plan of hanging to end his life, but does not have active plan today. He denies having access to this means. Is open to intensive outpatient services. 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   Still eating once a day, sometimes not at all. If he is using THC he eats more.   Still taking T once a week without issue.  No LMP recorded. (Menstrual status: Other).    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     cloNIDine (CATAPRES) 0.1 MG tablet Take 0.1 mg by mouth daily as needed.     lithium carbonate (LITHOBID) 300 MG CR tablet  PLEASE SEE ATTACHED FOR DETAILED DIRECTIONS     prazosin (MINIPRESS) 1 MG capsule Take by mouth.     SYRINGE-NEEDLE, DISP, 3 ML (B-D 3CC LUER-LOK SYR 25GX1/2") 25G X 1-1/2" 3 ML MISC USE FOR TESTOSTERONE INJECTION ONCE A WEEK 30 each 1   Syringe/Needle, Disp, (SYRINGE 3CC/25GX5/8") 25G X 5/8" 3 ML MISC Use once a week for testosterone injections 100 each 6   testosterone cypionate (DEPOTESTOSTERONE CYPIONATE) 200 MG/ML injection Inject 100 mg (0.5 ml) into the subcutaneous tissue once weekly 2 mL 4   No current facility-administered medications on file prior to visit.    Allergies  Allergen Reactions   Peanut-Containing Drug Products Anaphylaxis    Physical Exam:    Vitals:   03/12/21 1427  BP: 129/79  Pulse: 85  Weight: 244 lb 12.8 oz (111 kg)  Height: 5' 8.31" (1.735 m)    Growth percentile SmartLinks can only be used for patients less than 65 years old.  Physical Exam Vitals reviewed.  Constitutional:      Appearance: He is well-developed.  HENT:     Head: Normocephalic.  Neck:     Thyroid: No thyromegaly.  Cardiovascular:     Rate and Rhythm: Normal rate and regular rhythm.     Heart sounds: Normal heart sounds.  Pulmonary:     Effort: Pulmonary effort is normal.     Breath sounds: Normal  breath sounds.  Abdominal:     General: Bowel sounds are normal.     Palpations: Abdomen is soft.  Musculoskeletal:        General: Normal range of motion.  Lymphadenopathy:     Cervical: No cervical adenopathy.  Skin:    General: Skin is warm and dry.  Neurological:     Mental Status: He is alert and oriented to person, place, and time.  Psychiatric:        Mood and Affect: Affect normal. Mood is depressed.        Speech: Speech normal.        Behavior: Behavior normal.        Thought Content: Thought content does not include homicidal or suicidal ideation.        Cognition and Memory: Cognition normal.        Judgment: Judgment is impulsive.    Assessment/Plan: 1.  Post traumatic stress disorder (PTSD) Continues to be significantly impairing to his life and sleep. Continues with therapist once weekly but feels a lot of stress and sadness after appointments that lead him to want to use more drugs. Is open to IOP program which we will look for today. No active plan of suicide in office.   2. Male-to-male transgender person Continue testosterone weekly.   3. Severe episode of recurrent major depressive disorder, without psychotic features (HCC) Started on medications by mood treatment center. Recommended follow up with them ASAP.   4. Weight loss of more than 10% body weight Weight is stable today.   5. Substance use disorder Will refer to IOP. Discussed risks of continued use and strategies to avoid use. Has good community and family support.  - Ambulatory referral to Behavioral Health  Return in 2 weeks for close f/u.   [name], FNP

## 2021-03-27 ENCOUNTER — Ambulatory Visit: Payer: Medicaid Other | Admitting: Pediatrics

## 2021-03-28 ENCOUNTER — Encounter: Payer: Self-pay | Admitting: Pediatrics

## 2021-03-28 ENCOUNTER — Ambulatory Visit (INDEPENDENT_AMBULATORY_CARE_PROVIDER_SITE_OTHER): Payer: Medicaid Other | Admitting: Pediatrics

## 2021-03-28 ENCOUNTER — Other Ambulatory Visit: Payer: Self-pay

## 2021-03-28 ENCOUNTER — Ambulatory Visit (INDEPENDENT_AMBULATORY_CARE_PROVIDER_SITE_OTHER): Payer: Medicaid Other | Admitting: Licensed Clinical Social Worker

## 2021-03-28 ENCOUNTER — Ambulatory Visit (HOSPITAL_COMMUNITY)
Admission: EM | Admit: 2021-03-28 | Discharge: 2021-03-28 | Disposition: A | Discharge status: Home / Self Care | Payer: Medicaid Other | Attending: Nurse Practitioner | Admitting: Nurse Practitioner

## 2021-03-28 VITALS — BP 131/90 | HR 92 | Ht 68.31 in | Wt 248.8 lb

## 2021-03-28 DIAGNOSIS — F4310 Post-traumatic stress disorder, unspecified: Secondary | ICD-10-CM | POA: Diagnosis not present

## 2021-03-28 DIAGNOSIS — F332 Major depressive disorder, recurrent severe without psychotic features: Secondary | ICD-10-CM | POA: Diagnosis not present

## 2021-03-28 DIAGNOSIS — R45851 Suicidal ideations: Secondary | ICD-10-CM | POA: Insufficient documentation

## 2021-03-28 DIAGNOSIS — F331 Major depressive disorder, recurrent, moderate: Secondary | ICD-10-CM | POA: Insufficient documentation

## 2021-03-28 DIAGNOSIS — F1990 Other psychoactive substance use, unspecified, uncomplicated: Secondary | ICD-10-CM | POA: Diagnosis not present

## 2021-03-28 DIAGNOSIS — Z79899 Other long term (current) drug therapy: Secondary | ICD-10-CM | POA: Diagnosis not present

## 2021-03-28 NOTE — ED Provider Notes (Addendum)
Behavioral Health Urgent Care Medical Screening Exam  Patient Name: Jesse Luna MRN: [id number] Date of Evaluation: 03/29/21 Chief Complaint:   Diagnosis:  Final diagnoses:  MDD (major depressive disorder), recurrent episode, moderate (HCC)  PTSD (post-traumatic stress disorder)    History of Present illness: Jesse Luna is a 20 y.o. adult who presents to [facility] voluntarily with his friend [name]. On chart review, it is noted that the patient was referred to [facility] by [name], NP due to reports of SI.  Patient reports that the episode of SI occurred 2 weeks ago. He states that he had considered buying a rope to hang himself, but bought marijuana instead. Patient adamantly denies that he is currently suicidal. Patient states "I do not want to die." Patient reports that the items on the assessment tools are broad and ask about suicidal thoughts, depression, and anxiety within a 2 week period. Patient reports that he has experienced suicidal ideations intermittently since the age of 29. Patient denies homicidal ideations. He denies auditory and visual hallucinations. No indication that he is responding to internal stimuli.   Patient reports a history of 2 inpatient psychiatric hospitalizations. Patient states that he is currently followed by a medication provider at [facility]. He states that he is prescribed lithium 900 mg daily. He states that he consistently takes the lithium as prescribed.   Reviewed TTS assessment and validated with patient. On evaluation patient is alert and oriented x 4, pleasant, and cooperative. Speech is clear and coherent. Mood is depressed and affect is congruent with mood. Thought process is coherent and thought content is logical. Denies auditory and visual hallucinations. No indication that patient is responding to internal stimuli. No delusions elicited during this assessment. Denies thought insertion and thought broadcasting.  Denies  current suicidal ideations. Denies homicidal ideations. Reports regular use of marijuana. Denies use of other illicit substances. Patient states that he is able to verbally contract for safety if discharged.   Discussed options for continuous assessment, facility based crisis, and inpatient hospitalization. Patient continues to adamantly deny that he is suicidal. Patient declines admission for continuous assessment, facility based crisis, and inpatient psychiatric hospitalization. Patient states that his friend [name] has arranged for another friend, [name], to stay with the patient for at least 24 hours. Victorino Dike confirms that arrangements have been made for [facility] to stay with the patient.     Psychiatric Specialty Exam  Presentation  General Appearance:Appropriate for Environment  Eye Contact:Good  Speech:Clear and Coherent; Normal Rate  Speech Volume:Normal  Handedness:Right   Mood and Affect  Mood:Depressed  Affect:Congruent   Thought Process  Thought Processes:Coherent; Goal Directed; Linear  Descriptions of Associations:Intact  Orientation:Full (Time, Place and Person)  Thought Content:WDL    Hallucinations:None  Ideas of Reference:None  Suicidal Thoughts:No  Homicidal Thoughts:No   Sensorium  Memory:Immediate Good; Recent Good; Remote Good  Judgment:Intact  Insight:Present   Executive Functions  Concentration:Fair  Attention Span:Fair  Recall:Good  Fund of Knowledge:Good  Language:Good   Psychomotor Activity  Psychomotor Activity:Normal   Assets  Assets:Communication Skills; Desire for Improvement; Financial Resources/Insurance; Physical Health; Housing   Sleep  Sleep:Fair  Number of hours:  No data recorded  No data recorded  Physical Exam: Physical Exam Constitutional:      General: He is not in acute distress.    Appearance: He is not ill-appearing, toxic-appearing or diaphoretic.  HENT:     Head: Normocephalic.      Right Ear: External ear normal.  Left Ear: External ear normal.  Eyes:     Pupils: Pupils are equal, round, and reactive to light.  Cardiovascular:     Rate and Rhythm: Normal rate.  Pulmonary:     Effort: Pulmonary effort is normal. No respiratory distress.  Musculoskeletal:        General: Normal range of motion.  Skin:    General: Skin is warm and dry.  Neurological:     Mental Status: He is alert and oriented to person, place, and time.  Psychiatric:        Mood and Affect: Mood is anxious and depressed.        Speech: Speech normal.        Behavior: Behavior is cooperative.        Thought Content: Thought content is not paranoid or delusional. Thought content does not include homicidal or suicidal ideation. Thought content does not include suicidal plan.   Review of Systems  Constitutional:  Negative for chills, diaphoresis, fever, malaise/fatigue and weight loss.  HENT:  Negative for congestion.   Respiratory:  Negative for cough and shortness of breath.   Cardiovascular:  Negative for chest pain and palpitations.  Gastrointestinal:  Negative for diarrhea, nausea and vomiting.  Neurological:  Negative for dizziness and seizures.  Psychiatric/Behavioral:  Positive for depression and suicidal ideas. Negative for hallucinations, memory loss and substance abuse (marijuana). The patient is nervous/anxious and has insomnia.   All other systems reviewed and are negative.  Blood pressure (!) 140/111, pulse 78, temperature 98.4 F (36.9 C), temperature source Oral, resp. rate 18, height 5\' 9"  (1.753 m), weight 250 lb (113.4 kg), SpO2 100 %. Body mass index is 36.92 kg/m.  Musculoskeletal: Strength & Muscle Tone: within normal limits Gait & Station: normal Patient leans: N/A  Current Outpatient Medications  Medication Instructions   BD HYPODERMIC NEEDLE 25G X 1-1/2" MISC USE FOR TESTOSTERONE INJECTION ONCE A WEEK   Cholecalciferol (VITAMIN D3) 1.25 MG (50000 UT) CAPS  Oral   cloNIDine (CATAPRES) 0.1 mg, Oral, Daily PRN   lithium carbonate (LITHOBID) 300 MG CR tablet PLEASE SEE ATTACHED FOR DETAILED DIRECTIONS   prazosin (MINIPRESS) 1 MG capsule Oral   SYRINGE-NEEDLE, DISP, 3 ML (B-D 3CC LUER-LOK SYR 25GX1/2") 25G X 1-1/2" 3 ML MISC USE FOR TESTOSTERONE INJECTION ONCE A WEEK   Syringe/Needle, Disp, (SYRINGE 3CC/25GX5/8") 25G X 5/8" 3 ML MISC Use once a week for testosterone injections   testosterone cypionate (DEPOTESTOSTERONE CYPIONATE) 200 MG/ML injection Inject 100 mg (0.5 ml) into the subcutaneous tissue once weekly   Recent Results (from the past 2160 hour(s))  Amylase     Status: None   Collection Time: 02/20/21  3:40 PM  Result Value Ref Range   Amylase 22 21 - 101 U/L  Comprehensive metabolic panel     Status: Abnormal   Collection Time: 02/20/21  3:40 PM  Result Value Ref Range   Glucose, Bld 78 65 - 99 mg/dL    Comment: .            Fasting reference interval .    BUN 13 7 - 25 mg/dL   Creat 04/22/21 2.37 - 6.28 mg/dL   BUN/Creatinine Ratio NOT APPLICABLE 6 - 22 (calc)   Sodium 139 135 - 146 mmol/L   Potassium 4.4 3.5 - 5.3 mmol/L   Chloride 102 98 - 110 mmol/L   CO2 27 20 - 32 mmol/L   Calcium 10.6 (H) 8.6 - 10.3 mg/dL   Total Protein 8.1 6.1 -  8.1 g/dL   Albumin 5.2 (H) 3.6 - 5.1 g/dL   Globulin 2.9 1.9 - 3.7 g/dL (calc)   AG Ratio 1.8 1.0 - 2.5 (calc)   Total Bilirubin 0.8 0.2 - 1.2 mg/dL   Alkaline phosphatase (APISO) 163 (H) 36 - 130 U/L   AST 23 10 - 40 U/L   ALT 23 9 - 46 U/L  Ferritin     Status: None   Collection Time: 02/20/21  3:40 PM  Result Value Ref Range   Ferritin 70 38 - 380 ng/mL  Lipase     Status: None   Collection Time: 02/20/21  3:40 PM  Result Value Ref Range   Lipase 13 7 - 60 U/L  Magnesium     Status: None   Collection Time: 02/20/21  3:40 PM  Result Value Ref Range   Magnesium 2.1 1.5 - 2.5 mg/dL  Phosphorus     Status: None   Collection Time: 02/20/21  3:40 PM  Result Value Ref Range   Phosphorus  3.5 2.5 - 4.5 mg/dL  Sedimentation rate     Status: None   Collection Time: 02/20/21  3:40 PM  Result Value Ref Range   Sed Rate 2 0 - 15 mm/h  VITAMIN D 25 Hydroxy (Vit-D Deficiency, Fractures)     Status: None   Collection Time: 02/20/21  3:40 PM  Result Value Ref Range   Vit D, 25-Hydroxy 34 30 - 100 ng/mL    Comment: Vitamin D Status         25-OH Vitamin D: . Deficiency:                    <20 ng/mL Insufficiency:             20 - 29 ng/mL Optimal:                 > or = 30 ng/mL . For 25-OH Vitamin D testing on patients on  D2-supplementation and patients for whom quantitation  of D2 and D3 fractions is required, the [employer](TM) 25-OH VIT D, (D2,D3), LC/MS/MS is recommended: order  code 54098 (patients >23yrs). See Note 1 . Note 1 . For additional information, please refer to  http://education.Quest[facility].com/faq/FAQ199  (This link is being provided for informational/ educational purposes only.)   TSH + free T4     Status: None   Collection Time: 02/20/21  3:40 PM  Result Value Ref Range   TSH W/REFLEX TO FT4 2.34 0.40 - 4.50 mIU/L     Demographic Factors:  Adolescent or young adult, Caucasian, Gay, lesbian, or bisexual orientation, and Transgender  Loss Factors: Financial problems/change in socioeconomic status  Historical Factors: Family history of mental illness or substance abuse  Risk Reduction Factors:   Sense of responsibility to family, Positive social support, Positive therapeutic relationship, and Positive coping skills or problem solving skills. Patient is currently house sitting for his foster parents. A friend will be staying with the patient.  Continued Clinical Symptoms:  More than one psychiatric diagnosis Previous Psychiatric Diagnoses and Treatments  Cognitive Features That Contribute To Risk:  None    Suicide Risk:  Mild:  Suicidal ideation of limited frequency, intensity, duration, and specificity.  There are no identifiable  plans, no associated intent, mild dysphoria and related symptoms, good self-control (both objective and subjective assessment), few other risk factors, and identifiable protective factors, including available and accessible social support.   [facility] MSE Discharge Disposition for Follow up and Recommendations: Discussed  options for continuous assessment, facility based crisis, and inpatient hospitalization. Patient continues to adamantly deny that he is suicidal. Patient declines admission for continuous assessment, facility based crisis, and inpatient psychiatric hospitalization. Patient states that his friend [name] has arranged for another friend, [name], to stay with the patient for at least 24 hours. Victorino Dike confirms that arrangements have been made for [facility] to stay with the patient.   Follow up with mood treatment center.  Discharge recommendations:  Patient is to take medications as prescribed. Please see information for follow-up appointment with psychiatry and therapy. Please follow up with your primary care provider for all medical related needs.   Therapy: We recommend that patient participate in individual therapy to address mental health concerns.  Safety:  The patient should abstain from use of illicit substances/drugs and abuse of any medications.  Patient agrees to go to the closest hospital emergency department, the [facility], or call 911 for further evaluation and treatment if symptoms worsen or do not continue to improve or if he becomes actively suicidal.  National Suicide Prevention Lifeline 1-800-SUICIDE or 2166455982.  About 988 988 offers 24/7 access to trained crisis counselors who can help people experiencing mental health-related distress. People can call or text 988 or chat 988lifeline.org for themselves or if they are worried about a loved one who may need crisis support.   Please refrain from using alcohol or illicit  substances, as they can affect your mood and can cause depression, anxiety or other concerning symptoms.  Alcohol can increase the chance that a person will make reckless decisions, like attempting suicide, and can increase the lethality of a drug overdose.    Jackelyn Poling, NP 03/29/2021, 1:28 AM
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
## 2021-03-28 NOTE — Discharge Instructions (Addendum)
  Discharge recommendations:  Patient is to take medications as prescribed. Continue currently prescribed medications. Please see information for follow-up appointment with psychiatry and therapy. Please follow up with your primary care provider for all medical related needs.   Therapy: We recommend that patient participate in individual therapy to address mental health concerns.  Medications: The patient is to contact a medical professional and/or outpatient provider to address any new side effects that develop. Patient should update outpatient providers of any new medications and/or medication changes.   Atypical antipsychotics: If you are prescribed an atypical antipsychotic, it is recommended that your height, weight, BMI, blood pressure, fasting lipid panel, and fasting blood sugar be monitored by your outpatient providers.  Safety:  The patient should abstain from use of illicit substances/drugs and abuse of any medications. If symptoms worsen or do not continue to improve or if the patient becomes actively suicidal or homicidal then it is recommended that the patient return to the closest hospital emergency department, the [facility], or call 911 for further evaluation and treatment. National Suicide Prevention Lifeline 1-800-SUICIDE or 321-288-6769.  About 988 988 offers 24/7 access to trained crisis counselors who can help people experiencing mental health-related distress. People can call or text 988 or chat 988lifeline.org for themselves or if they are worried about a loved one who may need crisis support.   Discuss methods to reduce the risk of self-injury or suicide attempts: Frequent conversations regarding unsafe thoughts. Remove all significant sharps. Remove all firearms. Remove all medications, including over-the-counter meds. Consider lockbox for medications and having a responsible person dispense medications until patient has strengthened  coping skills. Room checks for sharps or other harmful objects. Secure all chemical substances that can be ingested or inhaled.   Please refrain from using alcohol or illicit substances, as they can affect your mood and can cause depression, anxiety or other concerning symptoms.  Alcohol can increase the chance that a person will make reckless decisions, like attempting suicide, and can increase the lethality of a drug overdose.

## 2021-03-28 NOTE — Progress Notes (Signed)
   03/28/21 1822  BHUC Triage Screening (Walk-ins at [facility] only)  How Did You Hear About [place]? Self  What Is the Reason for Your Visit/Call Today? Fredrich is a 20 yo male presenting as a walk in to [facility] for evaluation of SI with plan to hang himself. Pt reports that he had a visit with [name], NP today and expressed suicidal ideation with his plan and intent to possibly purchase rope for hanging. Pt states that instead of buying rope, he purchased marijuana and smoked it instead.  Pt reports that the suicidal ideation episode happened two weeks ago and that he is not actively suicidal at time of assessment.  Per [name], NP-- pt is not normal self and thoughts/behaviors are above baseline for pt. [name] stated that she feels Kairos is a danger to himself and that if he doesn't show up to [facility] this evening she would petition IVC.  Pt states that he is trying to be honest and many of the questions on the assessment ask if you have felt suicidal "within two weeks" or "within a month".  Pt reports that he currently is a patient of the [facility] and that they started lithium one month ago. Pt states that he had the suicidal episode two weeks later, and then the check-in today.  Pt reports that he has had suicidal ideation on and off since age 26. Pt has history of inpatient hospitalizations x 2.  Pt lives alone and does not have anyone to assist with safety plan. Pts friend, Victorino Dike is with him today and also feels that he could be a danger to himself.  How Long Has This Been Causing You Problems? 1-6 months (off and on for a month--it comes and goes.  never know if im going to kill myself)  Have You Recently Had Any Thoughts About Hurting Yourself? Yes  How long ago did you have thoughts about hurting yourself? two weeks ago  Are You Planning to Commit Suicide/Harm Yourself At This time? No  Have you Recently Had Thoughts About Hurting Someone Karolee Ohs? No  Are You Planning To Harm  Someone At This Time? No  Are you currently experiencing any auditory, visual or other hallucinations? No  Have You Used Any Alcohol or Drugs in the Past 24 Hours? Yes  How long ago did you use Drugs or Alcohol? last night drank alcohol and smoked marijuana yesterday.  smoke marijuana every day  What Did You Use and How Much? pt reports that he has used cocaine in the past but does not use it anymore.  powder cocaine  Do you have any current medical co-morbidities that require immediate attention? Yes  Please describe current medical co-morbidities that require immediate attention: possible hypothyroidism  Clinician description of patient physical appearance/behavior: pt dressed casually  What Do You Feel Would Help You the Most Today? Treatment for Depression or other mood problem  If access to [facility] Urgent Care was not available, would you have sought care in the Emergency Department? Yes  Determination of Need Urgent (48 hours) (SI with plan and intent per NP)  Options For Referral Outpatient Therapy;Medication Management;Therapeutic Triage Services

## 2021-03-28 NOTE — BH Specialist Note (Addendum)
Integrated Behavioral Health Initial In-Person Visit  MRN: [id number] Name: MARVELOUS BOUWENS  Number of Integrated Behavioral Health Clinician visits:: 1/6 Session Start time: 3:59 PM   Session End time: 5:52 PM  Total time:  113  minutes  Types of Service: Individual psychotherapy  Interpretor:No. Interpretor Name and Language: n/a   Warm Hand Off Completed.    Subjective: ASIA FAVATA is a 20 y.o. adult accompanied by  self Patient was referred by Candida Peeling FNP for suicidal ideation. Patient reports the following symptoms/concerns: continued depression and anxiety symptoms Duration of problem: weeks; Severity of problem: severe  Objective: Mood: Depressed and Affect: Appropriate Risk of harm to self or others: Suicidal ideation Suicide plan to buy a rope from [employer] to hang self with , has means to obtain rope, reported feeling isolated and having ineffective coping methods  Patient and/or Family's Strengths/Protective Factors: Social and Emotional competence and takes medications daily as prescribed, resilience, regular follow up with adolescent medicine team    Goals Addressed: Patient will: Reduce symptoms of: anxiety, depression, and suicidal ideation Increase knowledge and/or ability of: coping skills  Demonstrate ability to: Increase adequate support systems for patient/family  Progress towards Goals: Ongoing  Interventions: Interventions utilized: Motivational Interviewing, Solution-Focused Strategies, Psychoeducation and/or Health Education, and Supportive Reflection  Standardized Assessments completed: PHQ-SADS  Patient and/or Family Response: Patient reported that he would not go to hospital and did not want to be hospitalized. Patient worked to process recent stressors. Patient reported increase in feelings of isolation and hopelessness. Patient had difficulty identifying supports and reasons not to end his life. Patient reported taking new medications  this month and taking them regularly. Patient reported feeling medications were okay, however, symptoms have increased. Patient noted that he has always had ideation and plan, but did not wish to die until recently. Patient reported recent difficulty in scheduling with his counselor and was open to new referral. Patient agreed to contact support person, [name] to take him to [facility] for evaluation. Patient gave verbal permission for [facility] to provide update to [facility] in order to arrange  transportation. Patient remained at [facility] until support person was able to pick him up.   Patient Centered Plan: Patient is on the following Treatment Plan(s):  Anxiety and Depression  Assessment: Patient currently experiencing suicidal ideation and increase in depression symptoms.   Patient may benefit from evaluation with [facility] Urgent Care, follow up with [facility] BH and Adolescent Medicine, and connection to ongoing counseling services.  Plan: Follow up with behavioral health clinician on : 10/17 at 10:45 AM  Behavioral recommendations: Go to [facility] with social support, Britt Boozer, for evaluation. Continue to take medications as prescribed and return to [facility] or local ER if symptoms/ideations worsen, Follow up with [facility] next week Referral(s): Integrated Art gallery manager (In Clinic), Community Mental Health Services (LME/Outside Clinic), and [facility] Urgent Care  "From scale of 1-10, how likely are you to follow plan?": Patient agreeable to above plan   Carleene Overlie, [facility]

## 2021-03-28 NOTE — Progress Notes (Signed)
History was provided by the patient.  Jesse Luna is a 20 y.o. adult who is here for PTSD, anxiety, depression, substance use disorder.   Pcp, No   HPI:   Persistently suicidal with plan. Considered getting supplies for hanging on the way to our visit. Unsure anymore what keeps him alive. Used to have protective factor of little sister but does not feel like this even matters anymore. Has had two psych hospitalizations, last in 2018.   Pt reports still taking meds every day at the same time. Hasn't been to therapist in 3 weeks- mishaps in scheduling a few times.   Mood has been 'all over the place' got close to suicide about a week ago. Was planning to get stuff to do it but instead smoked MJ which calmed the feelings.   Staying at [employer] d/t being on vacation.   Last used cocaine about 2 weeks ago.   Sees psychiatrist again next week.   Still taking T regularly.   No LMP recorded. (Menstrual status: Other).   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     cloNIDine (CATAPRES) 0.1 MG tablet Take 0.1 mg by mouth daily as needed.     lithium carbonate (LITHOBID) 300 MG CR tablet PLEASE SEE ATTACHED FOR DETAILED DIRECTIONS     prazosin (MINIPRESS) 1 MG capsule Take by mouth.     SYRINGE-NEEDLE,  DISP, 3 ML (B-D 3CC LUER-LOK SYR 25GX1/2") 25G X 1-1/2" 3 ML MISC USE FOR TESTOSTERONE INJECTION ONCE A WEEK 30 each 1   Syringe/Needle, Disp, (SYRINGE 3CC/25GX5/8") 25G X 5/8" 3 ML MISC Use once a week for testosterone injections 100 each 6   testosterone cypionate (DEPOTESTOSTERONE CYPIONATE) 200 MG/ML injection Inject 100 mg (0.5 ml) into the subcutaneous tissue once weekly 2 mL 4   No current facility-administered medications on file prior to visit.    Allergies  Allergen Reactions   Peanut-Containing Drug Products Anaphylaxis    Physical Exam:    Vitals:   03/28/21 1517  BP: 131/90  Pulse: 92  Weight: 248 lb 12.8 oz (112.9 kg)  Height: 5' 8.31" (1.735 m)    Growth percentile SmartLinks can only be used for patients less than 75 years old.  Physical Exam Constitutional:      Appearance: Normal appearance.  HENT:     Head: Normocephalic.     Nose: Nose normal.  Pulmonary:     Effort: Pulmonary effort is normal.  Musculoskeletal:        General: Normal range of motion.     Cervical back: Normal range of motion.  Skin:    General: Skin is warm.  Neurological:     General: No focal deficit present.     Mental Status: He is alert.  Psychiatric:        Mood and  Affect: Mood is depressed. Affect is flat.        Speech: Speech normal.        Thought Content: Thought content includes suicidal ideation. Thought content includes suicidal plan.        Judgment: Judgment is impulsive.    Assessment/Plan: 1. Suicidal ideation Has persistent daily suicidal ideations, however, today appears to have worsened to me than in the past. Has had plan with intent to obtain supplies to carry out plan. Used to have protective factors with something to live for but today states no known reason to keep living and unsure of what keeps him from carrying out plan. Strained relationships with all support system.   Seen by behavioral health clinician as well. Discussed someone can come pick him  up and take him to [facility] today to ensure safety or will IVC as I am acutely concerned for his safety. Is irritable and angry about the idea of hospitalization.   Overheard on the phone stating- I will just lie the next time since I can't just be honest without them trying to put me in the hospital.   2. Severe episode of recurrent major depressive disorder, without psychotic features (HCC) Has missed therapy x 3. I am concerned that current medications aren't working well for depression.   3. Substance use disorder Using marijuana but has currently stopped using cocaine.   4. Post traumatic stress disorder (PTSD) Significantly impacts ongoing mental health. Continue therapy.    I spoke with friend Britt Boozer who works for [employer] who has been involved with [employer] ongoing. She has also been concerned for his safety. I called report to [facility] and spoke with Danny. Left my cellphone number in the event they have issues or questions.   Epimenio was picked up by Jenn at for safe transportation to [facility] for assessment. I would recommend inpatient placement for him based on my conversation with him today.   Follow up scheduled with [facility] Monday. She will also work on referral to new therapist given number of issues with current provider.   [name], FNP

## 2021-03-30 ENCOUNTER — Other Ambulatory Visit: Payer: Self-pay

## 2021-03-30 DIAGNOSIS — Z8659 Personal history of other mental and behavioral disorders: Secondary | ICD-10-CM | POA: Insufficient documentation

## 2021-03-30 DIAGNOSIS — R45851 Suicidal ideations: Secondary | ICD-10-CM | POA: Insufficient documentation

## 2021-03-30 DIAGNOSIS — F332 Major depressive disorder, recurrent severe without psychotic features: Secondary | ICD-10-CM | POA: Insufficient documentation

## 2021-03-30 DIAGNOSIS — F17210 Nicotine dependence, cigarettes, uncomplicated: Secondary | ICD-10-CM | POA: Insufficient documentation

## 2021-03-30 DIAGNOSIS — F1290 Cannabis use, unspecified, uncomplicated: Secondary | ICD-10-CM | POA: Insufficient documentation

## 2021-03-30 DIAGNOSIS — F10129 Alcohol abuse with intoxication, unspecified: Secondary | ICD-10-CM | POA: Insufficient documentation

## 2021-03-31 ENCOUNTER — Emergency Department (HOSPITAL_COMMUNITY)
Admission: EM | Admit: 2021-03-31 | Discharge: 2021-03-31 | Disposition: A | Discharge status: Other Acute Inpatient Hospital | Payer: Medicaid Other | Attending: Emergency Medicine | Admitting: Emergency Medicine

## 2021-03-31 ENCOUNTER — Encounter (HOSPITAL_COMMUNITY): Payer: Self-pay | Admitting: Nurse Practitioner

## 2021-03-31 ENCOUNTER — Ambulatory Visit (HOSPITAL_COMMUNITY)
Admission: EM | Admit: 2021-03-31 | Discharge: 2021-03-31 | Disposition: A | Discharge status: Other Acute Inpatient Hospital | Payer: Medicaid Other | Attending: Licensed Clinical Social Worker | Admitting: Licensed Clinical Social Worker

## 2021-03-31 ENCOUNTER — Other Ambulatory Visit (HOSPITAL_COMMUNITY)
Admission: EM | Admit: 2021-03-31 | Discharge: 2021-04-01 | Disposition: A | Discharge status: Other Acute Inpatient Hospital | Payer: Medicaid Other | Source: Home / Self Care | Attending: Psychiatry | Admitting: Psychiatry

## 2021-03-31 ENCOUNTER — Other Ambulatory Visit: Payer: Self-pay

## 2021-03-31 DIAGNOSIS — R45851 Suicidal ideations: Secondary | ICD-10-CM | POA: Insufficient documentation

## 2021-03-31 DIAGNOSIS — E039 Hypothyroidism, unspecified: Secondary | ICD-10-CM | POA: Insufficient documentation

## 2021-03-31 DIAGNOSIS — F17210 Nicotine dependence, cigarettes, uncomplicated: Secondary | ICD-10-CM | POA: Insufficient documentation

## 2021-03-31 DIAGNOSIS — Z91010 Allergy to peanuts: Secondary | ICD-10-CM | POA: Diagnosis not present

## 2021-03-31 DIAGNOSIS — F332 Major depressive disorder, recurrent severe without psychotic features: Secondary | ICD-10-CM | POA: Diagnosis not present

## 2021-03-31 DIAGNOSIS — Z79899 Other long term (current) drug therapy: Secondary | ICD-10-CM | POA: Insufficient documentation

## 2021-03-31 DIAGNOSIS — F1990 Other psychoactive substance use, unspecified, uncomplicated: Secondary | ICD-10-CM

## 2021-03-31 DIAGNOSIS — I10 Essential (primary) hypertension: Secondary | ICD-10-CM | POA: Diagnosis not present

## 2021-03-31 DIAGNOSIS — F32A Depression, unspecified: Secondary | ICD-10-CM | POA: Diagnosis present

## 2021-03-31 DIAGNOSIS — F17290 Nicotine dependence, other tobacco product, uncomplicated: Secondary | ICD-10-CM | POA: Insufficient documentation

## 2021-03-31 DIAGNOSIS — F319 Bipolar disorder, unspecified: Secondary | ICD-10-CM | POA: Diagnosis present

## 2021-03-31 DIAGNOSIS — F10129 Alcohol abuse with intoxication, unspecified: Secondary | ICD-10-CM | POA: Diagnosis not present

## 2021-03-31 DIAGNOSIS — Y906 Blood alcohol level of 120-199 mg/100 ml: Secondary | ICD-10-CM | POA: Diagnosis not present

## 2021-03-31 DIAGNOSIS — F10929 Alcohol use, unspecified with intoxication, unspecified: Secondary | ICD-10-CM | POA: Insufficient documentation

## 2021-03-31 DIAGNOSIS — Z8659 Personal history of other mental and behavioral disorders: Secondary | ICD-10-CM | POA: Insufficient documentation

## 2021-03-31 DIAGNOSIS — Z20822 Contact with and (suspected) exposure to covid-19: Secondary | ICD-10-CM | POA: Diagnosis not present

## 2021-03-31 DIAGNOSIS — F1290 Cannabis use, unspecified, uncomplicated: Secondary | ICD-10-CM | POA: Insufficient documentation

## 2021-03-31 LAB — CBC
HCT: 50.8 % (ref 39.0–52.0)
Hemoglobin: 16.9 g/dL (ref 13.0–17.0)
MCH: 29.9 pg (ref 26.0–34.0)
MCHC: 33.3 g/dL (ref 30.0–36.0)
MCV: 89.9 fL (ref 80.0–100.0)
Platelets: 364 10*3/uL (ref 150–400)
RBC: 5.65 MIL/uL (ref 4.22–5.81)
RDW: 13.2 % (ref 11.5–15.5)
WBC: 11 10*3/uL — ABNORMAL HIGH (ref 4.0–10.5)
nRBC: 0 % (ref 0.0–0.2)

## 2021-03-31 LAB — COMPREHENSIVE METABOLIC PANEL
ALT: 21 U/L (ref 0–44)
AST: 23 U/L (ref 15–41)
Albumin: 4.6 g/dL (ref 3.5–5.0)
Alkaline Phosphatase: 150 U/L — ABNORMAL HIGH (ref 38–126)
Anion gap: 13 (ref 5–15)
BUN: 8 mg/dL (ref 6–20)
CO2: 19 mmol/L — ABNORMAL LOW (ref 22–32)
Calcium: 9.2 mg/dL (ref 8.9–10.3)
Chloride: 107 mmol/L (ref 98–111)
Creatinine, Ser: 0.85 mg/dL (ref 0.61–1.24)
GFR, Estimated: >60 mL/min (ref >60)
Glucose, Bld: 92 mg/dL (ref 70–99)
Potassium: 3.7 mmol/L (ref 3.5–5.1)
Sodium: 139 mmol/L (ref 135–145)
Total Bilirubin: 0.5 mg/dL (ref 0.3–1.2)
Total Protein: 8.2 g/dL — ABNORMAL HIGH (ref 6.5–8.1)

## 2021-03-31 LAB — RAPID URINE DRUG SCREEN, HOSP PERFORMED
Amphetamines: NOT DETECTED
Barbiturates: NOT DETECTED
Benzodiazepines: NOT DETECTED
Cocaine: NOT DETECTED
Opiates: NOT DETECTED
Tetrahydrocannabinol: POSITIVE — AB

## 2021-03-31 LAB — TSH: TSH: 9.17 u[IU]/mL — ABNORMAL HIGH (ref 0.350–4.500)

## 2021-03-31 LAB — SALICYLATE LEVEL: Salicylate Lvl: <7 mg/dL — ABNORMAL LOW (ref 7.0–30.0)

## 2021-03-31 LAB — RESP PANEL BY RT-PCR (FLU A&B, COVID) ARPGX2
Influenza A by PCR: NEGATIVE
Influenza B by PCR: NEGATIVE
SARS Coronavirus 2 by RT PCR: NEGATIVE

## 2021-03-31 LAB — LITHIUM LEVEL: Lithium Lvl: 0.47 mmol/L — ABNORMAL LOW (ref 0.60–1.20)

## 2021-03-31 LAB — ETHANOL: Alcohol, Ethyl (B): 157 mg/dL — ABNORMAL HIGH (ref <10)

## 2021-03-31 LAB — ACETAMINOPHEN LEVEL: Acetaminophen (Tylenol), Serum: <10 ug/mL — ABNORMAL LOW (ref 10–30)

## 2021-03-31 MED ORDER — LOPERAMIDE HCL 2 MG PO CAPS: 2.0000 mg | ORAL_CAPSULE | ORAL | Status: DC | PRN

## 2021-03-31 MED ORDER — ACETAMINOPHEN 325 MG PO TABS: 650.0000 mg | ORAL_TABLET | Freq: Four times a day (QID) | ORAL | Status: DC | PRN

## 2021-03-31 MED ORDER — LITHIUM CARBONATE ER 300 MG PO TBCR
300.0000 mg | EXTENDED_RELEASE_TABLET | Freq: Every day | ORAL | Status: DC
  Administered 2021-03-31: 300 mg via ORAL
  Filled 2021-03-31: qty 1

## 2021-03-31 MED ORDER — PRAZOSIN HCL 1 MG PO CAPS
1.0000 mg | ORAL_CAPSULE | Freq: Every day | ORAL | Status: DC
  Filled 2021-03-31: qty 1

## 2021-03-31 MED ORDER — ADULT MULTIVITAMIN W/MINERALS CH
1.0000 | ORAL_TABLET | Freq: Every day | ORAL | Status: DC
  Administered 2021-03-31 – 2021-04-01 (×2): 1 via ORAL
  Filled 2021-03-31 (×2): qty 1

## 2021-03-31 MED ORDER — THIAMINE HCL 100 MG PO TABS
100.0000 mg | ORAL_TABLET | Freq: Every day | ORAL | Status: DC
  Administered 2021-03-31 – 2021-04-01 (×2): 100 mg via ORAL
  Filled 2021-03-31 (×2): qty 1

## 2021-03-31 MED ORDER — CLONIDINE HCL 0.1 MG PO TABS: 0.1000 mg | ORAL_TABLET | Freq: Every day | ORAL | Status: DC | PRN

## 2021-03-31 MED ORDER — ALUM & MAG HYDROXIDE-SIMETH 200-200-20 MG/5ML PO SUSP: 30.0000 mL | ORAL | Status: DC | PRN

## 2021-03-31 MED ORDER — HYDROXYZINE HCL 25 MG PO TABS: 25.0000 mg | ORAL_TABLET | Freq: Four times a day (QID) | ORAL | Status: DC | PRN

## 2021-03-31 MED ORDER — LORAZEPAM 1 MG PO TABS: 1.0000 mg | ORAL_TABLET | Freq: Four times a day (QID) | ORAL | Status: DC | PRN

## 2021-03-31 MED ORDER — ONDANSETRON 4 MG PO TBDP: 4.0000 mg | ORAL_TABLET | Freq: Four times a day (QID) | ORAL | Status: DC | PRN

## 2021-03-31 NOTE — ED Notes (Signed)
PTAR transport requested to [facility]

## 2021-03-31 NOTE — ED Provider Notes (Signed)
Behavioral Health Urgent Care Medical Screening Exam  Patient Name: Jesse Luna MRN: [id number] Date of Evaluation: 03/31/21 Chief Complaint:   Diagnosis:  Final diagnoses:  Severe episode of recurrent major depressive disorder, without psychotic features (HCC)  Alcohol abuse with intoxication (HCC)    History of Present illness: Jesse Luna is a 20 y.o. adult presenting to the [facility] for suicidal ideations with a plan to hang himself. Patient was brought in tonight to the [facility] by his friend [name] tonight. Patient states that he drank a liter of jack Daniels and smoked marijuana tonight before coming to [facility]. Patient presents with slurred speech, unsteady gait, and appears intoxicated. Patient will be sent out to the [facility] to be medically cleared before being admitted to [facility].   Psychiatric Specialty Exam  Presentation  General Appearance:Disheveled  Eye Contact:Fair  Speech:Slurred  Speech Volume:Decreased  Handedness:Right   Mood and Affect  Mood:Depressed; Hopeless; Irritable  Affect:Depressed; Labile   Thought Process  Thought Processes:Coherent; Goal Directed; Linear  Descriptions of Associations:Intact  Orientation:Full (Time, Place and Person)  Thought Content:Tangential; Scattered  Diagnosis of Schizophrenia or Schizoaffective disorder in past: No   Hallucinations:None  Ideas of Reference:None  Suicidal Thoughts:Yes, Active With Intent; With Plan  Homicidal Thoughts:No   Sensorium  Memory:Immediate Good; Recent Good; Remote Good  Judgment:Poor  Insight:Fair   Executive Functions  Concentration:Fair  Attention Span:Fair  Recall:Fair  Fund of Knowledge:Fair  Language:Fair   Psychomotor Activity  Psychomotor Activity:Shuffling Gait; Tremor   Assets  Assets:Desire for Improvement; Housing; Physical Health; Resilience; Social Support   Sleep  Sleep:Fair  Number of hours: 3   No data  recorded  Physical Exam: Physical Exam HENT:     Head: Normocephalic and atraumatic.     Nose: Nose normal.  Eyes:     Pupils: Pupils are equal, round, and reactive to light.  Cardiovascular:     Rate and Rhythm: Normal rate.  Pulmonary:     Effort: Pulmonary effort is normal.  Musculoskeletal:        General: Normal range of motion.     Cervical back: Normal range of motion.  Skin:    General: Skin is warm.  Neurological:     Mental Status: He is alert.     Gait: Gait abnormal.  Psychiatric:        Attention and Perception: Attention normal.        Mood and Affect: Mood is depressed. Affect is angry.        Speech: Speech normal.        Behavior: Behavior normal.        Thought Content: Thought content includes suicidal ideation. Thought content includes suicidal plan.        Cognition and Memory: Cognition normal.        Judgment: Judgment is impulsive.   Review of Systems  Constitutional: Negative.   HENT: Negative.    Eyes: Negative.   Respiratory: Negative.    Cardiovascular:        Elevated HR  Gastrointestinal: Negative.   Genitourinary: Negative.   Musculoskeletal: Negative.   Skin: Negative.   Neurological: Negative.   Endo/Heme/Allergies: Negative.   Psychiatric/Behavioral:  Positive for depression and suicidal ideas.   Blood pressure (!) 143/80, pulse (!) 128, temperature 98.7 F (37.1 C), resp. rate 18, SpO2 96 %. There is no height or weight on file to calculate BMI.  Musculoskeletal: Strength & Muscle Tone: within normal limits Gait & Station:  unsteady Patient leans: N/A   [facility] MSE Discharge Disposition for Follow up and Recommendations: Based on my evaluation the patient appears to have an emergency medical condition for which I recommend the patient be transferred to the emergency department for further evaluation. Report called in to accepting provider Dr. Madilyn Hook.   Jasper Riling, NP 03/31/2021, 12:47 AM

## 2021-03-31 NOTE — ED Notes (Signed)
Patient out of room on phone talking with family.

## 2021-03-31 NOTE — ED Notes (Signed)
Patient isolative to room. Patient monitored. Patient remains safe on unit.

## 2021-03-31 NOTE — BH Assessment (Signed)
Per EDP, Pt has been medically cleared and COVID test is negative. Theda Belfast, AC at [facility], states Pt has been accepted to [facility], room 161, under the service of Dr. Alyse Low. Number for RN report is [phone]. Notified Harvie Heck, PA-C and Irving Burton Merricks, RN of acceptance via secure message.   Pamalee Leyden, [facility], [facility] Triage Specialist [phone]

## 2021-03-31 NOTE — BH Assessment (Signed)
Medical record indicates that Pt has checked into [facility].   Pamalee Leyden, [facility], [facility] Triage Specialist [phone]

## 2021-03-31 NOTE — ED Notes (Signed)
Patient changed out into burgandy scrubs. 2 patient belongings bags located across from room 18.

## 2021-03-31 NOTE — ED Provider Notes (Signed)
[place] DEPT Provider Note   CSN: [id number] Arrival date & time: 03/31/21  0214     History Chief Complaint  Patient presents with   Medical Clearance    Jesse Luna is a 20 y.o. adult with a history of PTSD, depression, and hypothyroidism who presents to the emergency department for medical clearance from behavioral health.  Patient reports depression with suicidal ideation and plan to hang himself.  No specific alleviating or aggravating factors reported.  Was sent to the ED for medical clearance, recommended inpatient per chart review.  Patient denies chest pain, trouble breathing, abdominal pain, nausea, vomiting, diarrhea, fevers, or cough.  Admits to having had half 1/5 of alcohol earlier today, does not drink daily, does not have history of alcohol withdrawal.  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     Family History  Problem Relation Age of Onset   Alcohol abuse Mother    Alcohol abuse Father     Social History   Tobacco Use   Smoking status: Every Day    Types: E-cigarettes   Smokeless tobacco: Never  Vaping Use   Vaping Use: Every day  Substance Use Topics   Alcohol use: Yes    Comment:  occ   Drug use: Yes    Types: Marijuana    Home Medications Prior to Admission medications   Medication Sig Start Date End Date Taking? Authorizing Provider  BD HYPODERMIC NEEDLE 25G X 1-1/2" MISC USE FOR TESTOSTERONE INJECTION ONCE A WEEK 10/03/18   Jonathon Resides T, FNP  Cholecalciferol (VITAMIN D3) 1.25 MG (50000 UT) CAPS Take by mouth. 02/26/21   [provider]  cloNIDine (CATAPRES) 0.1 MG tablet Take 0.1 mg by mouth daily as needed. 02/25/21   [provider]  lithium carbonate (LITHOBID) 300 MG CR tablet PLEASE SEE ATTACHED FOR DETAILED DIRECTIONS 02/25/21   [provider]  prazosin (MINIPRESS) 1 MG capsule Take by mouth. 02/25/21   [provider]  SYRINGE-NEEDLE, [place], 3 ML (B-D 3CC LUER-LOK SYR 25GX1/2") 25G X 1-1/2" 3 ML MISC USE FOR TESTOSTERONE INJECTION ONCE A WEEK 07/10/20   Jonathon Resides T, FNP  Syringe/Needle, Disp, (SYRINGE 3CC/25GX5/8") 25G X 5/8" 3 ML MISC Use once a week for testosterone injections 07/10/20   Trude Mcburney, FNP  testosterone cypionate (DEPOTESTOSTERONE CYPIONATE) 200 MG/ML injection Inject 100 mg (0.5 ml) into the subcutaneous tissue once weekly 02/20/21   Jonathon Resides T, FNP    Allergies    Peanut-containing drug products  Review of Systems   Review of Systems  Constitutional:  Negative for chills and fever.  Respiratory:  Negative for shortness of breath.   Cardiovascular:  Negative for chest pain.  Gastrointestinal:  Negative for abdominal pain, diarrhea and vomiting.  Psychiatric/Behavioral:  Positive for suicidal ideas.   All other systems reviewed and are negative.  Physical Exam Updated Vital Signs BP 138/76 (BP Location: Left Arm)   Pulse (!) 105   Temp 98.1 F (36.7 C) (Oral)   Resp 20   Ht _0  (1.753 m)   Wt 113.4 kg   SpO2 94%   BMI 36.92 kg/m   Physical Exam Vitals and nursing note reviewed.  Constitutional:      General: He is not in acute distress.    Appearance: He is  well-developed. He is not toxic-appearing.  HENT:     Head: Normocephalic and atraumatic.  Eyes:     General:        Right eye: No discharge.        Left eye: No discharge.     Conjunctiva/sclera: Conjunctivae normal.  Cardiovascular:     Rate and Rhythm: Normal rate and regular rhythm.  Pulmonary:     Effort: Pulmonary effort is normal. No respiratory distress.     Breath sounds: Normal breath sounds. No wheezing, rhonchi or rales.  Abdominal:     General: There is no distension.     Palpations: Abdomen is soft.     Tenderness: There is no abdominal tenderness.  Musculoskeletal:     Cervical back: Neck supple.  Skin:    General: Skin is warm and dry.     Findings: No rash.  Neurological:     Mental Status: He is alert.     Comments: Clear speech.   Psychiatric:        Behavior: Behavior is cooperative.        Thought Content: Thought content includes suicidal ideation.    ED Results / Procedures / Treatments   Labs (all labs ordered are listed, but only abnormal results are displayed) Labs Reviewed  CBC - Abnormal; Notable for the following components:      Result Value   WBC 11.0 (*)    All other components within normal limits  RESP PANEL BY RT-PCR (FLU A&B, COVID) ARPGX2  COMPREHENSIVE METABOLIC PANEL  ETHANOL  SALICYLATE LEVEL  ACETAMINOPHEN LEVEL  RAPID URINE DRUG SCREEN, HOSP PERFORMED    EKG None  Radiology No results found.  Procedures Procedures   Medications Ordered in ED Medications - No data to display  ED Course  I have reviewed the triage vital signs and the nursing notes.  Pertinent labs & imaging results that were available during my care of the patient were reviewed by me and considered in my medical decision making (see chart for details).    MDM Rules/Calculators/A&P                           Patient presents to the ED for medical clearance.  Additional history obtained:  Additional history obtained from chart review & nursing  note review.  Significant for health urgent care, recommended for inpatient treatment.  Lab Tests:  Screening labs have been reviewed including CBC, CMP, acetaminophen/salicylate/ethanol level, UDS: EtOH elevated consistent w/ reported EtOH use today, UDS positive for tetrahydrocannabinol. Mildly low bicarb w/ alk phos elevation & mild leukocytosis- PCP follow up.   ED Course:  EtOH somewhat elevated however patient is alert, oriented, ambulatory and has clear speech. Patient is medically cleared. Accepted @ [place]- will transfer now.   Portions of this note were generated with Lobbyist. Dictation errors may  occur despite best attempts at proofreading.  Final Clinical Impression(s) / ED Diagnoses Final diagnoses:  Suicidal ideation    Rx / DC Orders ED Discharge Orders     None        Amaryllis Dyke, PA-C 03/31/21 0528    Quintella Reichert, MD 03/31/21 [phone]

## 2021-03-31 NOTE — ED Notes (Signed)
Pt asleep in bed. Respirations even and unlabored. Will continue to monitor for safety. ?

## 2021-03-31 NOTE — ED Triage Notes (Signed)
Pt presents with family friend having been to behavioral health and told they needed to come for medical clearance. Pt states he has been drinking a lot and today while under the influence of alcohol and marijuana tied a noose with a rope he had in his truck but states he did not attempt to hang himself.  States he doesn't want help currently with alcohol, "more so the suicide stuff"

## 2021-03-31 NOTE — ED Notes (Signed)
Pt resting in bed. A&O x4, calm and cooperative. Pt denies current SI/HI/AVH. Pt denies any current needs. No signs of acute distress noted. Will continue to monitor for safety.  

## 2021-03-31 NOTE — ED Triage Notes (Addendum)
Jesse Jesse Luna was BIB significant others d/t intoxication and verbalized suicidal ideation . Jesse Luna verbalized to this writer suicidal ideations with intent to hang self. Pt admits to drinking almost a fifth of Christiane Ha and smoked a joint of weed tonight and stated  " I feel pretty good right now ".

## 2021-03-31 NOTE — ED Notes (Signed)
REFUSED BREAKFAST

## 2021-03-31 NOTE — ED Notes (Signed)
Talked to pt hes doing work packets now. Out of the room. still doesn't want to eat but having juice

## 2021-03-31 NOTE — ED Notes (Signed)
[facility] REFUSED TO TRANSPORT PT TO [facility], STATING IT IS NOT A MEDICAL EMERGENCY.  FAMILY TO TRANSPORT PT TO [facility].

## 2021-03-31 NOTE — ED Notes (Signed)
Patient refused to eat. Patient reassured. Monitoring continues. Patient remains safe on unit.

## 2021-03-31 NOTE — ED Notes (Signed)
REFUSING FOOD STATED HE DOES NOT WANT TO EAT HERE AND BE HERE

## 2021-03-31 NOTE — ED Notes (Signed)
Patient admitted to unit. Patient upset about coming to unit. Patient reassured and provided support and encouragement. Monitoring continues.

## 2021-03-31 NOTE — ED Notes (Signed)
REFUSED DINNER
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
## 2021-03-31 NOTE — ED Notes (Addendum)
Pt's pulse fluctuates between 47-114 on dinamap. EKG obtained per order from today. Cecilio Asper, NP notified and shown EKG results. NP advised to hold Minipress tonight.
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
## 2021-03-31 NOTE — ED Notes (Signed)
Patient safely transferred into the safe transport vehicle.

## 2021-03-31 NOTE — ED Notes (Signed)
Provider notified of return from [facility] and location

## 2021-03-31 NOTE — ED Notes (Signed)
Called [facility] and they said it will be 8 minutes before they arrive

## 2021-03-31 NOTE — ED Notes (Signed)
Gave report to Theda [facility] at [facility]
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
## 2021-03-31 NOTE — ED Notes (Signed)
Patient returned from [facility] - skin check and vitals done - awaiting provider in rm 137

## 2021-04-01 ENCOUNTER — Inpatient Hospital Stay (HOSPITAL_COMMUNITY)
Admission: AD | Admit: 2021-04-01 | Discharge: 2021-04-05 | DRG: 885 | Disposition: A | Discharge status: Home / Self Care | Payer: Medicaid Other | Source: Intra-hospital | Attending: Psychiatry | Admitting: Psychiatry

## 2021-04-01 ENCOUNTER — Ambulatory Visit: Payer: Medicaid Other | Admitting: Licensed Clinical Social Worker

## 2021-04-01 DIAGNOSIS — F1290 Cannabis use, unspecified, uncomplicated: Secondary | ICD-10-CM | POA: Diagnosis not present

## 2021-04-01 DIAGNOSIS — F314 Bipolar disorder, current episode depressed, severe, without psychotic features: Principal | ICD-10-CM | POA: Diagnosis present

## 2021-04-01 DIAGNOSIS — F332 Major depressive disorder, recurrent severe without psychotic features: Secondary | ICD-10-CM | POA: Diagnosis not present

## 2021-04-01 DIAGNOSIS — F1990 Other psychoactive substance use, unspecified, uncomplicated: Secondary | ICD-10-CM | POA: Diagnosis not present

## 2021-04-01 DIAGNOSIS — F10129 Alcohol abuse with intoxication, unspecified: Secondary | ICD-10-CM | POA: Diagnosis not present

## 2021-04-01 DIAGNOSIS — R45851 Suicidal ideations: Secondary | ICD-10-CM | POA: Diagnosis present

## 2021-04-01 DIAGNOSIS — F17290 Nicotine dependence, other tobacco product, uncomplicated: Secondary | ICD-10-CM | POA: Diagnosis present

## 2021-04-01 DIAGNOSIS — F419 Anxiety disorder, unspecified: Secondary | ICD-10-CM | POA: Diagnosis present

## 2021-04-01 DIAGNOSIS — F4310 Post-traumatic stress disorder, unspecified: Secondary | ICD-10-CM | POA: Diagnosis present

## 2021-04-01 MED ORDER — MAGNESIUM HYDROXIDE 400 MG/5ML PO SUSP: 30.0000 mL | Freq: Every day | ORAL | Status: DC | PRN

## 2021-04-01 MED ORDER — OLANZAPINE 10 MG IM SOLR
10.0000 mg | Freq: Three times a day (TID) | INTRAMUSCULAR | Status: DC | PRN
Start: 2021-04-01 — End: 2021-04-01

## 2021-04-01 MED ORDER — OLANZAPINE 10 MG PO TBDP
10.0000 mg | ORAL_TABLET | Freq: Three times a day (TID) | ORAL | Status: DC | PRN
  Administered 2021-04-01: 10 mg via ORAL
  Filled 2021-04-01: qty 1

## 2021-04-01 MED ORDER — ALUM & MAG HYDROXIDE-SIMETH 200-200-20 MG/5ML PO SUSP: 30.0000 mL | ORAL | Status: DC | PRN

## 2021-04-01 MED ORDER — TRAZODONE HCL 50 MG PO TABS
50.0000 mg | ORAL_TABLET | Freq: Every evening | ORAL | Status: DC | PRN
  Administered 2021-04-02 – 2021-04-04 (×3): 50 mg via ORAL
  Filled 2021-04-01 (×3): qty 1

## 2021-04-01 MED ORDER — QUETIAPINE FUMARATE 100 MG PO TABS
100.0000 mg | ORAL_TABLET | Freq: Every day | ORAL | Status: DC
  Filled 2021-04-01 (×3): qty 1

## 2021-04-01 MED ORDER — LORAZEPAM 1 MG PO TABS
1.0000 mg | ORAL_TABLET | ORAL | Status: DC | PRN
Start: 2021-04-01 — End: 2021-04-05
  Filled 2021-04-01: qty 1

## 2021-04-01 MED ORDER — ZIPRASIDONE MESYLATE 20 MG IM SOLR: 20.0000 mg | INTRAMUSCULAR | Status: DC | PRN

## 2021-04-01 MED ORDER — ACETAMINOPHEN 325 MG PO TABS: 650.0000 mg | ORAL_TABLET | Freq: Four times a day (QID) | ORAL | Status: DC | PRN

## 2021-04-01 MED ORDER — HYDROXYZINE HCL 25 MG PO TABS
25.0000 mg | ORAL_TABLET | Freq: Three times a day (TID) | ORAL | Status: DC | PRN
  Administered 2021-04-04: 25 mg via ORAL
  Filled 2021-04-01: qty 1

## 2021-04-01 MED ORDER — OLANZAPINE 10 MG PO TBDP
10.0000 mg | ORAL_TABLET | Freq: Three times a day (TID) | ORAL | Status: DC | PRN
  Administered 2021-04-03: 10 mg via ORAL
  Filled 2021-04-01: qty 1

## 2021-04-01 NOTE — Progress Notes (Signed)
Pt accepted to [facility] [id number]    Patient meets inpatient criteria per Dr. Bronwen Betters   The attending provider will be Dr. Sherron Flemings  Call report to [phone]    Dickie La, RN @ [facility] notified.     Pt scheduled  to arrive at [facility] today by 1700. [facility] is waiting for IVC paperwork to be sent via fax. Patient should not be transported prior to [facility] receiving IVC.    Damita Dunnings, MSW, LCSW-A  3:26 PM 04/01/2021

## 2021-04-01 NOTE — Clinical Social Work Psych Note (Signed)
CSW Note   Per Dr. Bronwen Betters, MD the patient requires a higher level of care.  CSW and Dr. Bronwen Betters spoke with the patient's foster parents for additional collateral information. Jesse Luna and [name], [phone] or [phone]).  According to the patient's foster parents, they are very concerned about the patient. They shared that patient has a diagnosis of Bipolar disorder, and has experienced cycles of mania and depression (suicidal) over the years. They also shared that the patient has a history of substance use, which they believe contributes to his decompensation.   Patient's foster parents shared that the patient is SUICIDAL and has a plan, however they did not disclose what that plan is. They also expressed that they do not feel the patient is safe to discharge home or back into the community without more time in an inpatient setting.   Patient's supports were agreeable that the patient was not safe to discharge home alone, and that he needed a higher level of care for appropriate treatment.   [facility] is currently reviewing.    CSW will continue to follow.     Baldo Daub, MSW, LCSW Clinical Child psychotherapist (Facility Based Crisis) [facility]

## 2021-04-01 NOTE — ED Notes (Signed)
Patient is presently asleep in bed resting comfortably without distress or complaint.
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
## 2021-04-01 NOTE — ED Notes (Signed)
Pt has been really down today, does not want to be bothered has refuses all meals offered, breakfast and lunch. Pt went into group did not stay the entire time.

## 2021-04-01 NOTE — Progress Notes (Addendum)
Pt was transferred from [facility]. Pt came on the unit agitated, screaming and cussing. Pt stated that he does not want to be here. Pt stated that he made a statement when he was drunk and made a phone call that he should not have made. Pt was redirected but he was not receptive. PRN Zyprexa was offered to pt. Pt initially refused med but later asked writer what the medication is for. Medication explained to pt. Administered med with no incident. Pt continues to be be agitated.  Pt needs constant redirection. Staff will monitor for pt's safety.

## 2021-04-01 NOTE — Progress Notes (Signed)
Pt is asleep. Respirations are even and unlabored. No signs of acute distress noted. Monitoring for pt's safety.

## 2021-04-01 NOTE — ED Notes (Signed)
PT SLEEPING@THIS  TIME. BREATHING EVEN AND UNLABORED. WILL CONTINUE TO MONITOR FOR SAFETY

## 2021-04-01 NOTE — ED Notes (Signed)
Report given to Kathlene November RN@[facility]  adult

## 2021-04-01 NOTE — ED Notes (Signed)
Pt asleep in bed. Respirations even and unlabored. Will continue to monitor for safety. ?

## 2021-04-01 NOTE — ED Notes (Signed)
Patient agitated

## 2021-04-02 ENCOUNTER — Encounter (HOSPITAL_COMMUNITY): Payer: Self-pay | Admitting: Psychiatry

## 2021-04-02 ENCOUNTER — Other Ambulatory Visit: Payer: Self-pay

## 2021-04-02 DIAGNOSIS — F1990 Other psychoactive substance use, unspecified, uncomplicated: Secondary | ICD-10-CM

## 2021-04-02 DIAGNOSIS — F4310 Post-traumatic stress disorder, unspecified: Secondary | ICD-10-CM

## 2021-04-02 DIAGNOSIS — F314 Bipolar disorder, current episode depressed, severe, without psychotic features: Principal | ICD-10-CM

## 2021-04-02 LAB — T4, FREE: Free T4: 0.99 ng/dL (ref 0.61–1.12)

## 2021-04-02 MED ORDER — LITHIUM CARBONATE ER 450 MG PO TBCR
450.0000 mg | EXTENDED_RELEASE_TABLET | Freq: Two times a day (BID) | ORAL | Status: DC
  Administered 2021-04-02 – 2021-04-05 (×6): 450 mg via ORAL
  Filled 2021-04-02 (×12): qty 1

## 2021-04-02 MED ORDER — PRAZOSIN HCL 1 MG PO CAPS
1.0000 mg | ORAL_CAPSULE | Freq: Every day | ORAL | Status: DC
  Administered 2021-04-02 – 2021-04-04 (×3): 1 mg via ORAL
  Filled 2021-04-02 (×5): qty 1

## 2021-04-02 NOTE — Group Note (Signed)
Recreation Therapy Group Note   Group Topic:Animal Assisted Therapy   Group Date: 04/02/2021 Start Time: 1430 End Time: 1510 Facilitators: Caroll Rancher, LRT/CTRS Location: [address]  AAA/T Program Assumption of Risk Form signed by Patient/ or Parent Legal Guardian Yes  Patient understands his/her participation is voluntary Yes   Affect/Mood: N/A   Participation Level: Did not attend    Clinical Observations/Individualized Feedback: Pt did not attend group.    Plan: Continue to engage patient in RT group sessions 2-3x/week.   Caroll Rancher, LRT/CTRS 04/02/2021 4:33 PM

## 2021-04-02 NOTE — [facility] Suicide Risk Assessment (Signed)
[facility] Admission Suicide Risk Assessment   Nursing information obtained from:  Patient Demographic factors:  Male, Caucasian, Unemployed Current Mental Status:  Suicide plan Loss Factors:  Financial problems / change in socioeconomic status Historical Factors:  Prior suicide attempts Risk Reduction Factors:  Living with another person, especially a relative  Total Time spent with patient: 45 minutes Principal Problem: <principal problem not specified> Diagnosis:  Active Problems:   Bipolar 1 disorder, depressed, severe (HCC)  Subjective Data: patient was seen today with Dr. Hazle Quant PGY 1 present. He expresses being upset about being IVC'd. He has been having suicidal ideation since age 20, with thoughts to hang himself, and was planning to do it a month ago, but instead of buying the rope bought weed. He came to the the attention of psychiatry on this trip because he was in contact with his biological parents and started binge drinking and believes that his friends took him in for this reason. He denies that he is any more suicidal today than he usually is, and denies any intent to harm self or others. He doesn't recall some of the specifics around events immediately prior to the ER and during the ER, however. He has not really been eating well. He denies hallucinations. He feels that he has been overall sleeping regularly and focus has been okay. He has recently lost a job and a long term relationship.   Continued Clinical Symptoms:  Alcohol Use Disorder Identification Test Final Score (AUDIT): 7 The "Alcohol Use Disorders Identification Test", Guidelines for Use in Primary Care, Second Edition.  World Science writer [facility]). Score between 0-7:  no or low risk or alcohol related problems. Score between 8-15:  moderate risk of alcohol related problems. Score between 16-19:  high risk of alcohol related problems. Score 20 or above:  warrants further diagnostic evaluation for alcohol dependence and  treatment.   CLINICAL FACTORS:   Bipolar Disorder:   Depressive phase Alcohol/Substance Abuse/Dependencies More than one psychiatric diagnosis   Musculoskeletal: Strength & Muscle Tone: within normal limits Gait & Station: normal Patient leans: N/A  Psychiatric Specialty Exam:  Presentation  General Appearance: Disheveled  Eye Contact:Minimal  Speech:Clear and Coherent; Other (comment) (irritable tone of voice)  Speech Volume:Normal  Handedness:Right   Mood and Affect  Mood:Irritable; Dysphoric  Affect:Appropriate; Congruent   Thought Process  Thought Processes:Coherent; Goal Directed; Linear  Descriptions of Associations:Intact  Orientation:Full (Time, Place and Person)  Thought Content:Logical  History of Schizophrenia/Schizoaffective disorder:No  Duration of Psychotic Symptoms:No data recorded Hallucinations:Hallucinations: None  Ideas of Reference:None  Suicidal Thoughts:Suicidal Thoughts: -- (currently denies although per collateral and documentation from other providers patient has a plan and is unsafe for discharge)  Homicidal Thoughts:Homicidal Thoughts: No   Sensorium  Memory:Immediate Fair; Recent Fair; Remote Fair  Judgment:Poor  Insight:Lacking   Executive Functions  Concentration:Fair  Attention Span:Fair  Recall:Fair  Fund of Knowledge:Fair  Language:Fair   Psychomotor Activity  Psychomotor Activity:Psychomotor Activity: Normal   Assets  Assets:Desire for Improvement; Social Support; Housing; Resilience   Sleep  Sleep:Sleep: Fair    Physical Exam: Physical Exam Vitals and nursing note reviewed.  Constitutional:      Appearance: Normal appearance.  HENT:     Head: Normocephalic and atraumatic.     Nose: Nose normal.  Eyes:     Extraocular Movements: Extraocular movements intact.  Pulmonary:     Effort: Pulmonary effort is normal.  Musculoskeletal:     Cervical back: Normal range of motion.   Neurological:  General: No focal deficit present.     Mental Status: He is alert and oriented to person, place, and time.   Review of Systems  HENT:  Negative for hearing loss.   Eyes:  Negative for blurred vision.  Respiratory:  Negative for cough.   Cardiovascular:  Negative for chest pain.  Gastrointestinal:  Negative for constipation, diarrhea, nausea and vomiting.  Genitourinary:  Negative for dysuria.  Musculoskeletal:  Negative for myalgias.  Skin:  Negative for rash.  Neurological:  Negative for dizziness and headaches.  Psychiatric/Behavioral:  Positive for depression, substance abuse and suicidal ideas. Negative for hallucinations.   Blood pressure 115/81, pulse (!) 49, temperature 97.6 F (36.4 C), temperature source Oral, resp. rate 18, height 5\' 9"  (1.753 m), weight 109.3 kg, SpO2 100 %. Body mass index is 35.59 kg/m.   COGNITIVE FEATURES THAT CONTRIBUTE TO RISK:  Polarized thinking    SUICIDE RISK:   Moderate:  Frequent suicidal ideation with limited intensity, and duration, some specificity in terms of plans, no associated intent, good self-control, limited dysphoria/symptomatology, some risk factors present, and identifiable protective factors, including available and accessible social support.  PLAN OF CARE: Safety and Monitoring --  Admission to inpatient psychiatric unit for safety, stabilization and treatment -- Daily contact with patient to assess and evaluate symptoms and progress in treatment -- Patient's case to be discussed in multi-disciplinary team meeting. -- Patient will be encouraged to participate in the therapeutic group milieu. -- Observation Level : q15 minute checks -- Vital signs:  q12 hours -- Precautions: suicide.  Plan  -Monitor Vitals. -Monitor for thoughts of harm to self or others -Monitor for psychosis, disorganization or changes to cognition -Monitor for withdrawal symptoms. -Monitor for medication side  effects.  Labs/Studies: Lipids, A1c  Medications: Prazosin for nightmares Seroquel 100mg  PO QHS for mood and sleep PRN for anxiety and sleep        I certify that inpatient services furnished can reasonably be expected to improve the patient's condition.   , MD 04/02/2021, 4:36 PM
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
## 2021-04-02 NOTE — Progress Notes (Signed)
Pt A & O X4. Visible in bed, awake with congruent affect, depressed mood. Denies SI, HI, AVH and pain when assessed. Reports he slept fair last night with fair appetite "I'm not hungry. I'm just waiting to leave here in 72 hours" on initial interaction. Pt did not attend scheduled groups. Isolative in room this shift. Q 15 minutes safety checks maintained without self harm gestures or outburst to note thus far. Continued support, encouragement and reassurance provided to pt this shift.  Pt denies concerns at this time. Tolerates meals fairly without discomfort.

## 2021-04-02 NOTE — [facility] Group Notes (Signed)
The focus of this group is to help patients establish daily goals to achieve during treatment and discuss how the patient can incorporate goal setting into their daily lives to aide in recovery.  Pt did not attend morning goals group.  

## 2021-04-02 NOTE — [facility] Suicide Risk Assessment (Signed)
[facility] INPATIENT:  Family/Significant Other Suicide Prevention Education  Suicide Prevention Education:  Patient Refusal for Family/Significant Other Suicide Prevention Education: The patient Jesse Luna has refused to provide written consent for family/significant other to be provided Family/Significant Other Suicide Prevention Education during admission and/or prior to discharge.  Physician notified.  Metro Kung Jariah Tarkowski 04/02/2021, 10:29 AM
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
## 2021-04-02 NOTE — Tx Team (Signed)
Initial Treatment Plan 04/02/2021 12:33 AM Charleston Ropes WUJ:[id number]    PATIENT STRESSORS: Marital or family conflict   Medication change or noncompliance     PATIENT STRENGTHS: General fund of knowledge  Motivation for treatment/growth    PATIENT IDENTIFIED PROBLEMS: Risk for suicide  SA-ETOH  depression  "Nothing"               DISCHARGE CRITERIA:  Improved stabilization in mood, thinking, and/or behavior Verbal commitment to aftercare and medication compliance  PRELIMINARY DISCHARGE PLAN: Attend aftercare/continuing care group Attend PHP/IOP Outpatient therapy  PATIENT/FAMILY INVOLVEMENT: This treatment plan has been presented to and reviewed with the patient, Charleston Ropes.  The patient and family have been given the opportunity to ask questions and make suggestions.  Delos Haring, RN 04/02/2021, 12:33 AM

## 2021-04-02 NOTE — Progress Notes (Signed)
Psychoeducational Group Note  Date:  04/02/2021 Time:  2120  Group Topic/Focus:  Wrap-Up Group:   The focus of this group is to help patients review their daily goal of treatment and discuss progress on daily workbooks.  Participation Level: Did Not Attend  Participation Quality:  Not Applicable  Affect:  Not Applicable  Cognitive:  Not Applicable  Insight:  Not Applicable  Engagement in Group: Not Applicable  Additional Comments:  The patient did not attend group this evening.   Hazle Coca S 04/02/2021, 9:20 PM

## 2021-04-03 ENCOUNTER — Encounter (HOSPITAL_COMMUNITY): Payer: Self-pay

## 2021-04-03 ENCOUNTER — Telehealth (HOSPITAL_COMMUNITY): Payer: Self-pay

## 2021-04-03 LAB — T3, FREE: T3, Free: 3.2 pg/mL (ref 2.0–4.4)

## 2021-04-03 MED ORDER — OLANZAPINE 10 MG IM SOLR: 5.0000 mg | Freq: Four times a day (QID) | INTRAMUSCULAR | Status: DC | PRN
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
## 2021-04-03 NOTE — Group Note (Signed)
Therapy Type: Group Therapy: Self Esteem  Participation Level:  Active   Patients received a worksheet with an outline of 2 gingerbread men with a separation in the middle of the page. One sign designated what the pt sees about themselves and the other is what others see. Pts were asked to introduce themselves and share something they like about themself. Pts were then asked to draw, write or color how they view themselves as well as how they are viewed by others. CSW led discussion self esteem and beahviors to improve self esteem.   Patient Summary:  Patient participated appropriately in group. Patient participated in group activitity and discussed ways to improve self esteem.    Bartley Vuolo, LCSW, LCAS Clincal Social Worker  [facility]

## 2021-04-03 NOTE — Progress Notes (Signed)
Adult Psychoeducational Group Note  Date:  04/03/2021 Time:  11:00 PM  Group Topic/Focus:  Wrap-Up Group:   The focus of this group is to help patients review their daily goal of treatment and discuss progress on daily workbooks.  Participation Level:  Active  Participation Quality:  Appropriate  Affect:  Appropriate  Cognitive:  Appropriate  Insight: Appropriate  Engagement in Group:  Engaged  Modes of Intervention:  Discussion  Additional Comments:  patient attend wrap up group. His day was a 7. The one positive thing that happen he went to group.  Charna Busman Long 04/03/2021, 11:00 PM

## 2021-04-03 NOTE — Progress Notes (Signed)
Pt visible on the unit a lot this evening. Pt stated he was going to try to attend more groups tomorrow.     04/03/21 2200  Psych Admission Type (Psych Patients Only)  Admission Status Involuntary  Psychosocial Assessment  Patient Complaints Anxiety;Worrying  Eye Contact Fair  Facial Expression Sad;Worried  Affect Appropriate to circumstance  Speech Logical/coherent;Soft  Interaction Cautious;Guarded;Isolative  Motor Activity Slow  Appearance/Hygiene Unremarkable  Behavior Characteristics Anxious  Mood Depressed  Aggressive Behavior  Effect No apparent injury  Thought Process  Coherency Circumstantial  Content Blaming others  Delusions None reported or observed  Perception WDL  Hallucination None reported or observed  Judgment Poor  Confusion None  Danger to Self  Current suicidal ideation? Denies  Self-Injurious Behavior No self-injurious ideation or behavior indicators observed or expressed   Agreement Not to Harm Self Yes  Description of Agreement verbal contract for safety  Danger to Others  Danger to Others None reported or observed

## 2021-04-03 NOTE — [facility] Group Notes (Signed)
The focus of this group is to help patients establish daily goals to achieve during treatment and discuss how the patient can incorporate goal setting into their daily lives to aide in recovery.  Pt did not attend morning goals group.  

## 2021-04-03 NOTE — Progress Notes (Signed)
Pt rates his anxiety, depression and hopelessness all 0/10. Reports he slept well last night with fair appetite, normal energy and good concentration level. However, pt's mood remains labile, he's easily angered when d/c demands are not met. Outburst X1 this, bang his head on bed twice as per [facility] staff shift during safety checks. Writer went into pt's room to speak with him. Pt refused to speak with writer demanding to "Speak to the fucking doctor now. I'm not suicidal, homicidal or anything. I want to fucking leave now". PRN Zyprexa 10 mg PO given at 1238. Pt declined PRN Ativan when offered due to continued verbal escalation "I'm not going to turn into a fucking Zombie like y'all did to my sister". Observed in [place] group when reassessed at 1320. Assigned provider made aware of pt's outburst and has assessed pt. Support and reassurance provided to pt. All medications given with verbal education and effects monitored. Pt encouraged to voice concerns. Q 15 minutes safety checks maintained. Pt remains medication compliant. Denies discomfort when assessed. Pt declined lunch "I don't want to eat, I want to go the fuck home". Remains safe on and off unit.

## 2021-04-03 NOTE — BH IP Treatment Plan (Signed)
Interdisciplinary Treatment and Diagnostic Plan Update  04/03/2021 Time of Session: 9:15am  Jesse Luna MRN: [id number]  Principal Diagnosis: <principal problem not specified>  Secondary Diagnoses: Active Problems:   PTSD (post-traumatic stress disorder)   Substance use disorder   Bipolar 1 disorder, depressed, severe (HCC)   Current Medications:  Current Facility-Administered Medications  Medication Dose Route Frequency Provider Last Rate Last Admin   alum & mag hydroxide-simeth (MAALOX/MYLANTA) 829-937-16 MG/5ML suspension 30 mL  30 mL Oral Q4H PRN Ival Bible, MD       hydrOXYzine (ATARAX/VISTARIL) tablet 25 mg  25 mg Oral TID PRN Ival Bible, MD       lithium carbonate (ESKALITH) CR tablet 450 mg  450 mg Oral Barton Fanny, MD   450 mg at 04/03/21 0818   OLANZapine zydis (ZYPREXA) disintegrating tablet 10 mg  10 mg Oral Q8H PRN Ival Bible, MD   10 mg at 04/03/21 1238   And   LORazepam (ATIVAN) tablet 1 mg  1 mg Oral PRN Ival Bible, MD       magnesium hydroxide (MILK OF MAGNESIA) suspension 30 mL  30 mL Oral Daily PRN Ival Bible, MD       OLANZapine (ZYPREXA) injection 5 mg  5 mg Intramuscular Q6H PRN Hill, Jackie Plum, MD       prazosin (MINIPRESS) capsule 1 mg  1 mg Oral QHS France Ravens, MD   1 mg at 04/02/21 2138   traZODone (DESYREL) tablet 50 mg  50 mg Oral QHS PRN Ival Bible, MD   50 mg at 04/02/21 2138   PTA Medications: Medications Prior to Admission  Medication Sig Dispense Refill Last Dose   BD HYPODERMIC NEEDLE 25G X 1-1/2" MISC USE FOR TESTOSTERONE INJECTION ONCE A WEEK 90 each 11    Cholecalciferol (VITAMIN D-3) 125 MCG (5000 UT) TABS Take 1 tablet by mouth daily.      Cholecalciferol (VITAMIN D3) 1.25 MG (50000 UT) CAPS Take by mouth. (Patient not taking: Reported on 03/31/2021)      cloNIDine (CATAPRES) 0.1 MG tablet Take 0.1 mg by mouth daily as needed.      lithium carbonate (LITHOBID) 300 MG  CR tablet Take 900 mg by mouth at bedtime.      prazosin (MINIPRESS) 1 MG capsule Take 1 mg by mouth daily as needed.      SYRINGE-NEEDLE, DISP, 3 ML (B-D 3CC LUER-LOK SYR 25GX1/2") 25G X 1-1/2" 3 ML MISC USE FOR TESTOSTERONE INJECTION ONCE A WEEK 30 each 1    Syringe/Needle, Disp, (SYRINGE 3CC/25GX5/8") 25G X 5/8" 3 ML MISC Use once a week for testosterone injections 100 each 6    testosterone cypionate (DEPOTESTOSTERONE CYPIONATE) 200 MG/ML injection Inject 100 mg (0.5 ml) into the subcutaneous tissue once weekly (Patient taking differently: Inject 100 mg into the muscle once a week. Inject 100 mg (0.5 ml) into the subcutaneous tissue once weekly) 2 mL 4     Patient Stressors: Marital or family conflict   Medication change or noncompliance    Patient Strengths: General fund of knowledge  Motivation for treatment/growth   Treatment Modalities: Medication Management, Group therapy, Case management,  1 to 1 session with clinician, Psychoeducation, Recreational therapy.   Physician Treatment Plan for Primary Diagnosis: <principal problem not specified> Long Term Goal(s): Improvement in symptoms so as ready for discharge   Short Term Goals: Ability to identify changes in lifestyle to reduce recurrence of condition will improve Ability to verbalize  feelings will improve Ability to disclose and discuss suicidal ideas Ability to demonstrate self-control will improve Ability to identify and develop effective coping behaviors will improve Ability to maintain clinical measurements within normal limits will improve Compliance with prescribed medications will improve Ability to identify triggers associated with substance abuse/mental health issues will improve  Medication Management: Evaluate patient's response, side effects, and tolerance of medication regimen.  Therapeutic Interventions: 1 to 1 sessions, Unit Group sessions and Medication administration.  Evaluation of Outcomes: Not  Met  Physician Treatment Plan for Secondary Diagnosis: Active Problems:   PTSD (post-traumatic stress disorder)   Substance use disorder   Bipolar 1 disorder, depressed, severe ([place])  Long Term Goal(s): Improvement in symptoms so as ready for discharge   Short Term Goals: Ability to identify changes in lifestyle to reduce recurrence of condition will improve Ability to verbalize feelings will improve Ability to disclose and discuss suicidal ideas Ability to demonstrate self-control will improve Ability to identify and develop effective coping behaviors will improve Ability to maintain clinical measurements within normal limits will improve Compliance with prescribed medications will improve Ability to identify triggers associated with substance abuse/mental health issues will improve     Medication Management: Evaluate patient's response, side effects, and tolerance of medication regimen.  Therapeutic Interventions: 1 to 1 sessions, Unit Group sessions and Medication administration.  Evaluation of Outcomes: Not Met   RN Treatment Plan for Primary Diagnosis: <principal problem not specified> Long Term Goal(s): Knowledge of disease and therapeutic regimen to maintain health will improve  Short Term Goals: Ability to remain free from injury will improve, Ability to participate in decision making will improve, Ability to verbalize feelings will improve, Ability to disclose and discuss suicidal ideas, and Ability to identify and develop effective coping behaviors will improve  Medication Management: RN will administer medications as ordered by provider, will assess and evaluate patient's response and provide education to patient for prescribed medication. RN will report any adverse and/or side effects to prescribing provider.  Therapeutic Interventions: 1 on 1 counseling sessions, Psychoeducation, Medication administration, Evaluate responses to treatment, Monitor vital signs and CBGs as  ordered, Perform/monitor CIWA, COWS, AIMS and Fall Risk screenings as ordered, Perform wound care treatments as ordered.  Evaluation of Outcomes: Not Met   LCSW Treatment Plan for Primary Diagnosis: <principal problem not specified> Long Term Goal(s): Safe transition to appropriate next level of care at discharge, Engage patient in therapeutic group addressing interpersonal concerns.  Short Term Goals: Engage patient in aftercare planning with referrals and resources, Increase social support, Increase emotional regulation, Facilitate acceptance of mental health diagnosis and concerns, Identify triggers associated with mental health/substance abuse issues, and Increase skills for wellness and recovery  Therapeutic Interventions: Assess for all discharge needs, 1 to 1 time with Social worker, Explore available resources and support systems, Assess for adequacy in community support network, Educate family and significant other(s) on suicide prevention, Complete Psychosocial Assessment, Interpersonal group therapy.  Evaluation of Outcomes: Not Met   Progress in Treatment: Attending groups: No. Participating in groups: No. Taking medication as prescribed: Yes. Toleration medication: Yes. Family/Significant other contact made: Yes, individual(s) contacted:  Foster Parents  Patient understands diagnosis: No. Discussing patient identified problems/goals with staff: Yes. Medical problems stabilized or resolved: Yes. Denies suicidal/homicidal ideation: Yes. Issues/concerns per patient self-inventory: No.   New problem(s) identified: No, Describe:  None   New Short Term/Long Term Goal(s): medication stabilization, elimination of SI thoughts, development of comprehensive mental wellness plan.   Patient Goals: "  To go home"   Discharge Plan or Barriers: Patient recently admitted. CSW will continue to follow and assess for appropriate referrals and possible discharge planning.   Reason for  Continuation of Hospitalization: Anxiety Medication stabilization Suicidal ideation Withdrawal symptoms  Estimated Length of Stay: 3 to 5 days    Scribe for Treatment Team: Darleen Crocker, Latanya Presser 04/03/2021 2:11 PM

## 2021-04-03 NOTE — Group Note (Signed)
Recreation Therapy Group Note   Group Topic:Stress Management  Group Date: 04/03/2021 Start Time: 0930 End Time: 0945 Facilitators: Caroll Rancher, LRT/CTRS Location: [address] Dayroom  Goal Area(s) Addresses:  Patient will identify positive stress management techniques. Patient will identify benefits of using stress management post d/c.   Group Description:  Meditation.  LRT played a meditation that focused on setting personal boundaries for self care.  The meditation focused on learning to say no for things you don't want to do instead of trying to make others happy at the expense of yourself.   Affect/Mood: N/A   Participation Level: Did not attend    Clinical Observations/Individualized Feedback: Pt did not attend group.    Plan: Continue to engage patient in RT group sessions 2-3x/week.   Caroll Rancher, LRT/CTRS  04/03/2021 12:02 PM

## 2021-04-03 NOTE — BH Assessment (Signed)
Care Management - Follow Up [facility] Discharges   Writer attempted to make contact with patient today and was unsuccessful.  Writer left a HIPPA compliant voice message.   

## 2021-04-03 NOTE — Progress Notes (Signed)
Pt kept to himself this evening, pt received PRN Trazodone per [facility] with HS medication    04/03/21 0000  Psych Admission Type (Psych Patients Only)  Admission Status Involuntary  Psychosocial Assessment  Patient Complaints Worrying;Anxiety  Eye Contact Fair  Facial Expression Sad;Worried  Affect Appropriate to circumstance  Speech Logical/coherent;Soft  Interaction Cautious;Guarded;Isolative  Motor Activity Slow  Appearance/Hygiene Unremarkable  Behavior Characteristics Unwilling to participate  Mood Anxious;Depressed  Aggressive Behavior  Effect No apparent injury  Thought Process  Coherency Circumstantial  Content Blaming others  Delusions None reported or observed  Perception WDL  Hallucination None reported or observed  Judgment Poor  Confusion None  Danger to Self  Current suicidal ideation? Denies  Self-Injurious Behavior No self-injurious ideation or behavior indicators observed or expressed   Agreement Not to Harm Self Yes  Description of Agreement verbal contract for safety  Danger to Others  Danger to Others None reported or observed

## 2021-04-03 NOTE — Plan of Care (Addendum)
Spoke with foster mother Ricky Stabs [phone]) today regarding patient's current inability to attend groups and participate in treatment beyond taking medication at this time.  Mother states that patient tends to be reserved during inpatient treatment and has significant substance use while outpatient.  Mother states that patient had been using cocaine daily a month ago prior to [facility] visit.  Mother states that they are concerned that patient is unsafe for discharge at this time.  Patient also is not welcome to stay with parents should patient continue to use substances including alcohol.  Mother expresses this concern since they have a 99-year-old in the household.  Mother states patient will have hunger strikes as well as act out in order to get his way as patient apparently attempts to disturb the people around him with his behavior.  Explained to mother that patient does have history of trauma related to inpatient hospitalization which may be contributory to the reason why he is not presently participating in therapy.  Mother will requesting family meeting with foster father and patient present in order to ensure that safety planning can be made as well as ensure that family is all on the same page regarding conditions of discharge.  Mother's primary concern is that patient has genetic predisposition to addiction and substance use as biological family extensively drinks and uses substances.  Discussed with mother that we will work towards scheduling a family meeting in order to expedite discharge planning as well as assess how patient is doing inpatient when family meeting occurs.

## 2021-04-04 LAB — HEMOGLOBIN A1C
Hgb A1c MFr Bld: 4.9 % (ref 4.8–5.6)
Mean Plasma Glucose: 93.93 mg/dL

## 2021-04-04 LAB — LIPID PANEL
Cholesterol: 155 mg/dL (ref 0–200)
HDL: 36 mg/dL — ABNORMAL LOW (ref >40)
LDL Cholesterol: 105 mg/dL — ABNORMAL HIGH (ref 0–99)
Total CHOL/HDL Ratio: 4.3 RATIO
Triglycerides: 70 mg/dL (ref <150)
VLDL: 14 mg/dL (ref 0–40)

## 2021-04-04 MED ORDER — ACETAMINOPHEN 325 MG PO TABS: 650.0000 mg | ORAL_TABLET | Freq: Four times a day (QID) | ORAL | Status: DC | PRN

## 2021-04-04 NOTE — BHH Counselor (Signed)
CSW spoke with [name] (Father) who states that he and his wife will be available for a family meeting on Monday at 12:00pm.  CSW has let the doctor know about this date and time as well.

## 2021-04-04 NOTE — BHH Group Notes (Signed)
Pt did not attend crisis management group.  

## 2021-04-04 NOTE — Progress Notes (Addendum)
[facility] MD Progress Note  04/04/2021 5:48 PM Jesse Luna  MRN:  [id number] Subjective:  Patient is a 20 year old transgender male to male with history of alcohol use disorder, PTSD, marijuana use, and anxiety presenting to [facility] involuntarily from [facility] due to SI.  Patient currently has been medically cleared from [facility] ED  Chart Review, 24 hr Events: The patient's chart was reviewed and nursing notes were reviewed. The patient's case was discussed in multidisciplinary team meeting.  Per MAR: - Patient is compliant with scheduled meds. - PRNs: trazodone x1  Per RN notes, no documented behavioral issues and is attending group. Patient slept, 6.75 hours  Patient had the following psychiatric recommendations yesterday: Resume home Meds: -lithium 450 mg bid -prazosin 1 mg qd -Trazodone 50 mg prn for sleep -Hydroxyzine 25 mg tid prn  Today's Interview Patient seen and assessed with Dr. Loleta Chance attending.  Patient was tired this morning but much more amenable to attending group therapy today.  Patient said that this is goal to attend more group therapies as he had only attended 1 yesterday.  Patient also states that her mood has improved since yesterday.  Patient explained that he was very angry and agitated yesterday because of waking up from a dream that had him hypervigilant.  Patient is apologetic for the mood he has been in.  Patient states that he is agreeable to social worker speaking with parents regarding safety planning and discharge planning.  Patient agreeable to family meeting in order to expedite his discharge.  Discussed with patient that this writer has already spoke with his parents and summarized content of conversation.  Patient verbalized understanding and is eager for family meeting in order to discharge.  Patient had no further questions for this provider.   Patient denies any physical complaints including headache, nausea, vomiting, chest pain, headache.  Patient denies  present SI/HI/AVH.    Principal Problem: <principal problem not specified> Diagnosis: Active Problems:   PTSD (post-traumatic stress disorder)   Substance use disorder   Bipolar 1 disorder, depressed, severe (HCC)  Total Time spent with patient: 30 minutes  Past Psychiatric History: see H&P Past Medical History:  Past Medical History:  Diagnosis Date   Allergy    Thyroid disease     Past Surgical History:  Procedure Laterality Date   TONSILLECTOMY     Family History:  Family History  Problem Relation Age of Onset   Alcohol abuse Mother    Alcohol abuse Father    Family Psychiatric  History: See H&P Social History:  Social History   Substance and Sexual Activity  Alcohol Use Yes   Comment: occ     Social History   Substance and Sexual Activity  Drug Use Yes   Types: Marijuana    Social History   Socioeconomic History   Marital status: Single    Spouse name: Not on file   Number of children: Not on file   Years of education: Not on file   Highest education level: Not on file  Occupational History   Not on file  Tobacco Use   Smoking status: Every Day    Types: E-cigarettes   Smokeless tobacco: Never  Vaping Use   Vaping Use: Every day   Substances: Nicotine, THC  Substance and Sexual Activity   Alcohol use: Yes    Comment: occ   Drug use: Yes    Types: Marijuana   Sexual activity: Not Currently  Other Topics Concern   Not on file  Social History Narrative   Not on file   Social Determinants of Health   Financial Resource Strain: Not on file  Food Insecurity: Not on file  Transportation Needs: Not on file  Physical Activity: Not on file  Stress: Not on file  Social Connections: Not on file   Additional Social History:                         Sleep: Good  Appetite:  Fair  Current Medications: Current Facility-Administered Medications  Medication Dose Route Frequency Provider Last Rate Last Admin   acetaminophen (TYLENOL)  tablet 650 mg  650 mg Oral Q6H PRN Park Pope, MD       alum & mag hydroxide-simeth (MAALOX/MYLANTA) 200-200-20 MG/5ML suspension 30 mL  30 mL Oral Q4H PRN Estella Husk, MD       hydrOXYzine (ATARAX/VISTARIL) tablet 25 mg  25 mg Oral TID PRN Estella Husk, MD       lithium carbonate (ESKALITH) CR tablet 450 mg  450 mg Oral Q12H Park Pope, MD   450 mg at 04/04/21 0832   OLANZapine zydis (ZYPREXA) disintegrating tablet 10 mg  10 mg Oral Q8H PRN Estella Husk, MD   10 mg at 04/03/21 1238   And   LORazepam (ATIVAN) tablet 1 mg  1 mg Oral PRN Estella Husk, MD       magnesium hydroxide (MILK OF MAGNESIA) suspension 30 mL  30 mL Oral Daily PRN Estella Husk, MD       OLANZapine (ZYPREXA) injection 5 mg  5 mg Intramuscular Q6H PRN Roselle Locus, MD       prazosin (MINIPRESS) capsule 1 mg  1 mg Oral QHS Park Pope, MD   1 mg at 04/03/21 2120   traZODone (DESYREL) tablet 50 mg  50 mg Oral QHS PRN Estella Husk, MD   50 mg at 04/03/21 2120    Lab Results:  Results for orders placed or performed during the hospital encounter of 04/01/21 (from the past 48 hour(s))  Lipid panel     Status: Abnormal   Collection Time: 04/04/21  6:47 AM  Result Value Ref Range   Cholesterol 155 0 - 200 mg/dL   Triglycerides 70 <979 mg/dL   HDL 36 (L) >89 mg/dL   Total CHOL/HDL Ratio 4.3 RATIO   VLDL 14 0 - 40 mg/dL   LDL Cholesterol 211 (H) 0 - 99 mg/dL    Comment:        Total Cholesterol/HDL:CHD Risk Coronary Heart Disease Risk Table                     Men   Women  1/2 Average Risk   3.4   3.3  Average Risk       5.0   4.4  2 X Average Risk   9.6   7.1  3 X Average Risk  23.4   11.0        Use the calculated Patient Ratio above and the CHD Risk Table to determine the patient's CHD Risk.        ATP III CLASSIFICATION (LDL):  <100     mg/dL   Optimal  941-740  mg/dL   Near or Above                    Optimal  130-159  mg/dL   Borderline  814-481  mg/dL  High  >190     mg/dL   Very High Performed at [facility], [address] [place], [place] [address]   Hemoglobin A1c     Status: None   Collection Time: 04/04/21  6:47 AM  Result Value Ref Range   Hgb A1c MFr Bld 4.9 4.8 - 5.6 %    Comment: (NOTE) Pre diabetes:          5.7%-6.4%  Diabetes:              >6.4%  Glycemic control for   <7.0% adults with diabetes    Mean Plasma Glucose 93.93 mg/dL    Comment: Performed at [facility], [address] [place], [place] [address]    Blood Alcohol level:  Lab Results  Component Value Date   ETH 157 (H) 03/31/2021   ETH <10 06/13/2017    Metabolic Disorder Labs: Lab Results  Component Value Date   HGBA1C 4.9 04/04/2021   MPG 93.93 04/04/2021   MPG 103 07/10/2020   Lab Results  Component Value Date   PROLACTIN 36.8 (H) 06/14/2017   Lab Results  Component Value Date   CHOL 155 04/04/2021   TRIG 70 04/04/2021   HDL 36 (L) 04/04/2021   CHOLHDL 4.3 04/04/2021   VLDL 14 04/04/2021   LDLCALC 105 (H) 04/04/2021   LDLCALC 104 (H) 07/10/2020    Physical Findings:  Musculoskeletal: Strength & Muscle Tone: unable to assess as patient is in bed Gait & Station: unable to assess as patient is in bed Patient leans: N/A  Psychiatric Specialty Exam:  Presentation  General Appearance: Casual; Disheveled  Eye Contact:Minimal  Speech:Clear and Coherent  Speech Volume:Normal  Handedness:Right   Mood and Affect  Mood:Euthymic  Affect:Blunt   Thought Process  Thought Processes:Coherent; Goal Directed; Linear  Descriptions of Associations:Intact  Orientation:Full (Time, Place and Person)  Thought Content:Logical  History of Schizophrenia/Schizoaffective disorder:No  Duration of Psychotic Symptoms:No data recorded Hallucinations:Hallucinations: None  Ideas of Reference:None  Suicidal Thoughts:Suicidal Thoughts: No  Homicidal Thoughts:Homicidal Thoughts: No   Sensorium   Memory:Immediate Fair; Recent Fair; Remote Fair  Judgment:Fair  Insight:Poor; Fair   Art therapist  Concentration:Fair  Attention Span:Fair  Recall:Fair  [employer] of Knowledge:Fair  Language:Fair   Psychomotor Activity  Psychomotor Activity:Psychomotor Activity: Normal   Assets  Assets:Desire for Improvement; Social Support; Resilience   Sleep  Sleep:Sleep: Fair Number of Hours of Sleep: 5    Physical Exam: Physical Exam Vitals and nursing note reviewed.  Constitutional:      Appearance: Normal appearance. He is normal weight.  HENT:     Head: Normocephalic and atraumatic.  Pulmonary:     Effort: Pulmonary effort is normal.  Neurological:     General: No focal deficit present.     Mental Status: He is oriented to person, place, and time.   Review of Systems  Respiratory:  Negative for shortness of breath.   Cardiovascular:  Negative for chest pain.  Gastrointestinal:  Negative for abdominal pain, constipation, diarrhea, heartburn, nausea and vomiting.  Neurological:  Negative for headaches.  Blood pressure 117/81, pulse 86, temperature 98.7 F (37.1 C), temperature source Oral, resp. rate 16, height 5\' 9"  (1.753 m), weight 109.3 kg, SpO2 99 %. Body mass index is 35.59 kg/m.   Treatment Plan Summary: Daily contact with patient to assess and evaluate symptoms and progress in treatment and Medication management    ASSESSMENT Patient is a 20 year old transgender male to male with history of  alcohol use disorder, PTSD, marijuana use, and anxiety presenting to [facility] involuntarily from [facility] due to SI.  Patient had been medically cleared from [facility] ED. please see note regarding collateral information from foster mom.  Patient will likely require continued hospitalization for psychiatric stabilization.  Plan for family meeting to clarify discharge planning Monday 12:00.    PLAN Psychiatric Problems Bipolar Disorder, unspecified (r/o MDD) Suicidal  Ideation Alcohol Use Disorder Lithium 450 mg bid for bipolar disorder (TSH 9.17, free t4 0.99) On agitation protocol Prazosin for PTSD and nightmares Trazodone 50 mg as needed for sleep  Hydroxyzine 25 mg 3 times daily as needed for anxiety NRT protocol in place   Medical Problems A1c 4.9, HDL 36, LDL cholesterol 105  Testosterone for Gender transitioning -Patient would like to do testosterone injections after discharge.   Subclinical hypothyroidism TSH 9.17, free T4 0.99 -Recommend outpatient follow-up to monitor   PRNs Maalox/Mylanta 30 mL for indigestion Bendaryl q6hr for itching/allergies Hydroxyzine 25 mg tid for anxiety Milk of Magnesia 30 mL for constipation Trazodone 50 mg for sleep   3. Safety and Monitoring: Involuntary admission to inpatient psychiatric unit for safety, stabilization and treatment Daily contact with patient to assess and evaluate symptoms and progress in treatment Patient's case to be discussed in multi-disciplinary team meeting Observation Level : q15 minute checks Vital signs: q12 hours Precautions: suicide, elopement, and assault   4. Discharge Planning: Social work and case management to assist with discharge planning and identification of hospital follow-up needs prior to discharge Estimated LOS: 5-7 days Discharge Concerns: Need to establish a safety plan; Medication compliance and effectiveness Discharge Goals: Return home with outpatient referrals for mental health follow-up including medication management/psychotherapy    Park Pope, MD 04/04/2021, 5:48 PM

## 2021-04-04 NOTE — Progress Notes (Signed)
DAR NOTE: Patient presents with a blunted affect and mood.  Denies suicidal thoughts, auditory and visual hallucinations.  Described energy level as normal with good concentration.  Rates depression at 0, hopelessness at 0, and anxiety at 0.  Maintained on routine safety checks.  Medications given as prescribed.  Support and encouragement offered as needed.  Refused to attend group when invited.  States goal for today is "being in a better mood."  Patient is withdrawn and isolates to his room.  Patient is safe on the unit.

## 2021-04-04 NOTE — BHH Suicide Risk Assessment (Signed)
BHH INPATIENT:  Family/Significant Other Suicide Prevention Education  Suicide Prevention Education:  Education Completed; Jesse Luna [phone] [facility] Father) has been identified by the patient as the family member/significant other with whom the patient will be residing, and identified as the person(s) who will aid the patient in the event of a mental health crisis (suicidal ideations/suicide attempt).  With written consent from the patient, the family member/significant other has been provided the following suicide prevention education, prior to the and/or following the discharge of the patient.  The suicide prevention education provided includes the following: Suicide risk factors Suicide prevention and interventions National Suicide Hotline telephone number [facility] assessment telephone number [facility] Emergency Assistance 911 [facility] and/or Residential Mobile Crisis Unit telephone number  Request made of family/significant other to: Remove weapons (e.g., guns, rifles, knives), all items previously/currently identified as safety concern.   Remove drugs/medications (over-the-counter, prescriptions, illicit drugs), all items previously/currently identified as a safety concern.  The family member/significant other verbalizes understanding of the suicide prevention education information provided.  The family member/significant other agrees to remove the items of safety concern listed above.  CSW spoke with Jesse Luna who states that approximately every 6 months his son goes to visit with his biological family and comes back depressed and using substances (Marijuana and Alcohol).  He states that on this last visit his son also came back using Cocaine.  He states that his son has been visiting his biological family more in the last year since he moved out of Mr. Jackelyn Knife home.  Jesse Luna states that his son lives on his own and recently lost his job.  He also  states that they do not help him financially and neither do his biological parents.  Jesse Luna states that he would like a family meeting with the doctors and social work. Jesse Luna agrees to schedule the family meeting for Monday and states that he will call the CSW back with an available time.  Jesse Luna states that there are no known weapons or firearms in his son's home.  CSW completed SPE with Jesse Luna.   Metro Kung Imaan Padgett 04/04/2021, 10:06 AM

## 2021-04-04 NOTE — Group Note (Unsigned)
Group Topic: Goal Setting  Group Date: 04/04/2021 Start Time: 0900 End Time: 1000 Facilitators: Reymundo Poll, NT  [facility]  Number of Participants: 9  Group Focus: abuse issues Treatment Modality:  Behavior Modification Therapy Interventions utilized were clarification Purpose: enhance coping skills   Name: Jesse Luna Date of Birth: [birth year]  MR: [id number]    Level of Participation: {THERAPIES; PSYCH GROUP PARTICIPATION XYIAX:65537} Quality of Participation: {THERAPIES; PSYCH QUALITY OF PARTICIPATION:23992} Interactions with others: {THERAPIES; PSYCH INTERACTIONS:23993} Mood/Affect: {THERAPIES; PSYCH MOOD/AFFECT:23994} Triggers (if applicable): *** Cognition: {THERAPIES; PSYCH COGNITION:23995} Progress: {THERAPIES; PSYCH PROGRESS:23997} Response: *** Plan: {THERAPIES; PSYCH SMOL:07867}  Patients Problems:  Patient Active Problem List   Diagnosis Date Noted   Bipolar 1 disorder, depressed, severe (HCC) 04/01/2021   Bipolar 1 disorder (HCC) 03/31/2021   Suicidal ideation 03/28/2021   MDD (major depressive disorder), recurrent episode, moderate (HCC)    Substance use disorder 03/12/2021   Severe episode of recurrent major depressive disorder, without psychotic features (HCC) 03/12/2021   Weight loss of more than 10% body weight 03/12/2021   Elevated BP without diagnosis of hypertension 07/31/2020   History of peanut allergy 07/07/2017   Hypothyroidism 07/07/2017   Male-to-male transgender person 07/07/2017   PTSD (post-traumatic stress disorder) 06/14/2017

## 2021-04-04 NOTE — Progress Notes (Signed)
Patient did not attend wrap up group. 

## 2021-04-04 NOTE — Progress Notes (Signed)
Pt visible on the unit minimal, but did go to group for a little while. Pt given PRN Vistaril and Trazodone per MAR with HS medication    04/04/21 2200  Psych Admission Type (Psych Patients Only)  Admission Status Involuntary  Psychosocial Assessment  Patient Complaints None  Eye Contact Fair  Facial Expression Sad;Worried  Affect Appropriate to circumstance  Speech Logical/coherent;Soft  Interaction Cautious;Guarded;Isolative  Motor Activity Slow  Appearance/Hygiene Unremarkable  Behavior Characteristics Cooperative  Mood Depressed  Aggressive Behavior  Effect No apparent injury  Thought Process  Coherency Circumstantial  Content Blaming others  Delusions None reported or observed  Perception WDL  Hallucination None reported or observed  Judgment Poor  Confusion None  Danger to Self  Current suicidal ideation? Denies  Self-Injurious Behavior No self-injurious ideation or behavior indicators observed or expressed   Agreement Not to Harm Self Yes  Description of Agreement verbal contract for safety  Danger to Others  Danger to Others None reported or observed

## 2021-04-05 MED ORDER — TRAZODONE HCL 50 MG PO TABS: 50.0000 mg | ORAL_TABLET | Freq: Every evening | ORAL | 0 refills | Status: DC | PRN

## 2021-04-05 NOTE — Group Note (Signed)
LCSW Group Therapy Note  Group Date: 04/05/2021 Start Time: 1300 End Time: 1330   Type of Therapy and Topic:  Group Therapy - Healthy vs Unhealthy Coping Skills  Participation Level:  Active   Description of Group The focus of this group was to determine what unhealthy coping techniques typically are used by group members and what healthy coping techniques would be helpful in coping with various problems. Patients were guided in becoming aware of the differences between healthy and unhealthy coping techniques. Patients were asked to identify 2-3 healthy coping skills they would like to learn to use more effectively.  Therapeutic Goals Patients learned that coping is what human beings do all day long to deal with various situations in their lives Patients defined and discussed healthy vs unhealthy coping techniques Patients identified their preferred coping techniques and identified whether these were healthy or unhealthy Patients determined 2-3 healthy coping skills they would like to become more familiar with and use more often. Patients provided support and ideas to each other   Summary of Patient Progress:   Due to the acuity and complex discharge plans, group was not held. Patient was provided therapeutic worksheets and asked to meet with CSW as needed.    Therapeutic Modalities Cognitive Behavioral Therapy Motivational Interviewing  Felizardo Hoffmann, Theresia Majors 04/05/2021  1:20 PM

## 2021-04-05 NOTE — BHH Group Notes (Signed)
Pt did not attend goal setting group.

## 2021-04-05 NOTE — Progress Notes (Signed)
  [facility] Adult Case Management Discharge Plan :  Will you be returning to the same living situation after discharge:  Yes,  personal apartment At discharge, do you have transportation home?: Yes,  close family friends Do you have the ability to pay for your medications: Yes,  Medicaid  Release of information consent forms completed and in the chart;  Patient's signature needed at discharge.  Patient to Follow up at:  Follow-up Information     [facility] Follow up on 04/10/2021.   Why: You are scheduled for therapy on 04/10/21.  This is a regularly scheduled appointment at the same time each week.  The next appointment after this one will be on 04/17/21. Contact information: Address: [address] [place], [place] [address]  Phone: [phone] Fax [phone]        [facility] Follow up on 04/15/2021.   Why: You are scheduled for medication management on 04/15/21 at 11:00am.  This appointment will be held in-person. Contact information: [address] [place] [address] [phone]         [facility] Follow up.   Why: You may go to this provider for substance abuse intensive outpatient therapy services during walk in hours:  Monday through Friday, from 6:00 am to 10:30 am. Contact information: [address] [place], [place] [address]  P:  [phone] F:  [phone]                Next level of care provider has access to [facility] Link:no  Safety Planning and Suicide Prevention discussed: Yes,  mother and father     Has patient been referred to the Quitline?: Patient refused referral  Patient has been referred for addiction treatment: Pt. refused referral  Chrys Racer 04/05/2021, 9:56 AM

## 2021-04-05 NOTE — Plan of Care (Signed)
Spoke with foster father [name] [phone]).  Discussed the fact that patient has refused family meeting on Monday.  Also patient has refused to continue to participate in therapy and care here.  Foster father verbalizes understanding and is aware that patient is discharging today.  Also father says that patient is no longer welcome near them as they are "done with this right now".  Father has explained that multiple efforts have been made to facilitate patient's care but given that patient insists that he is a grown adult, foster father will allow patient to be responsible for his own care.  Foster father appreciates support that we have provided.  Park Pope, MD PGY1 Psychiatry

## 2021-04-05 NOTE — Discharge Summary (Signed)
Physician Discharge Summary Note  Patient:  Jesse Luna is an 20 y.o., adult MRN:  [id number] DOB:  [birth year] Patient phone:  [phone] (home)  Patient address:   [address] [place] [address],  Total Time spent with patient:  I personally spent 60 minutes on the unit in direct patient care. The direct patient care time included face-to-face time with the patient, reviewing the patient's chart, communicating with other professionals, and coordinating care. Greater than 50% of this time was spent in counseling or coordinating care with the patient regarding goals of hospitalization, psycho-education, and discharge planning needs.   Date of Admission:  04/01/2021 Date of Discharge: 04/05/2021  Reason for Admission:  Patient is a [age] year old transgender male to male with history of alcohol use disorder, PTSD, marijuana use, and anxiety presenting to [facility] involuntarily from [facility] due to SI.  Patient currently has been medically cleared from [facility] ED.  Principal Problem: Bipolar 1 disorder, depressed, severe (HCC) Discharge Diagnoses: Principal Problem:   Bipolar 1 disorder, depressed, severe (HCC) Active Problems:   PTSD (post-traumatic stress disorder)   Substance use disorder   Past Psychiatric History: see H&P  Past Medical History:  Past Medical History:  Diagnosis Date   Allergy    Thyroid disease     Past Surgical History:  Procedure Laterality Date   TONSILLECTOMY     Family History:  Family History  Problem Relation Age of Onset   Alcohol abuse Mother    Alcohol abuse Father    Family Psychiatric  History: see H&P Social History:  Social History   Substance and Sexual Activity  Alcohol Use Yes   Comment: occ     Social History   Substance and Sexual Activity  Drug Use Yes   Types: Marijuana    Social History   Socioeconomic History   Marital status: Single    Spouse name: Not on file   Number of children: Not on file    Years of education: Not on file   Highest education level: Not on file  Occupational History   Not on file  Tobacco Use   Smoking status: Every Day    Types: E-cigarettes   Smokeless tobacco: Never  Vaping Use   Vaping Use: Every day   Substances: Nicotine, THC  Substance and Sexual Activity   Alcohol use: Yes    Comment: occ   Drug use: Yes    Types: Marijuana   Sexual activity: Not Currently  Other Topics Concern   Not on file  Social History Narrative   Not on file   Social Determinants of Health   Financial Resource Strain: Not on file  Food Insecurity: Not on file  Transportation Needs: Not on file  Physical Activity: Not on file  Stress: Not on file  Social Connections: Not on file    Hospital Course:   After the above admission evaluation, patient's presenting symptoms were noted. Patient was recommended for mood stabilizer. The medication regimen targeting those presenting symptoms were discussed with patient & initiated with patient's consent. Patient was resumed on Lithium 450 mg bid, prazosin 1 mg qd  Patient had multiple complaints throughout the hospitalization and felt that the medication regiment was appropriate. Patient endorsed that he did not belong here and that he's triggered by being inpatient for past traumas.  Pertinent labs drawn during hospitalizations include: A1c 4.9, LDL cholesterol 105, HDL 36, wnl T3/T4   During the course of patient's hospitalization, the  15-minute checks were adequate to ensure patient's safety. Patient did exhibited aggressive behavior but was compliant with scheduled medication. Patient was extremely agitated and minimally attending groups. Patient was recommended for outpatient psychiatry and therapy.  At the time of discharge patient is not reporting any acute suicidal/homicidal ideations/AVH, delusional thoughts or paranoia. Patient did not appear to be responding to any internal stimuli. Patient feels more confident about  self-care & in managing their mental health problems. Patient currently denies any new issues or concerns. Education and supportive counseling provided throughout patient's hospital stay & upon discharge.   Today upon discharge evaluation with the attending psychiatrist Dr. Loleta Chance, patient's mood is "fine". Patient denies any specific concerns. Patient slept well, appetite good, regular bowel movements. Patient denies any physical complaints. Patient feels that the medications have been helpful & is in agreement to continue current treatment regimen as recommended. Patient was able to engage in safety planning including plan to return to [facility] or contact emergency services if patient feels unable to maintain their own safety or the safety of others. Patient had no further questions, comments, or concerns. Patient left [facility] with all personal belongings in no apparent distress. Patient plans for discharge to home.  Physical Findings:  Musculoskeletal: Strength & Muscle Tone: within normal limits Gait & Station: normal Patient leans: N/A   Psychiatric Specialty Exam:  Presentation  General Appearance: Casual; Appropriate for Environment  Eye Contact:Minimal  Speech:Clear and Coherent  Speech Volume:Normal  Handedness:Right   Mood and Affect  Mood:Euthymic  Affect:Blunt   Thought Process  Thought Processes:Coherent; Goal Directed; Linear  Descriptions of Associations:Intact  Orientation:Full (Time, Place and Person)  Thought Content:Logical  History of Schizophrenia/Schizoaffective disorder:No  Duration of Psychotic Symptoms:No data recorded Hallucinations:No data recorded  Ideas of Reference:None  Suicidal Thoughts:No data recorded  Homicidal Thoughts:No data recorded   Sensorium  Memory:Immediate Fair; Recent Fair; Remote Fair  Judgment:Fair  Insight:Fair   Executive Functions  Concentration:Fair  Attention Span:Fair  Recall:Fair  Fund of  Knowledge:Fair  Language:Fair   Psychomotor Activity  Psychomotor Activity:No data recorded   Assets  Assets:Desire for Improvement; Resilience   Sleep  Sleep:No data recorded    Physical Exam: Physical Exam Vitals and nursing note reviewed.  Constitutional:      Appearance: Normal appearance. He is normal weight.  HENT:     Head: Normocephalic and atraumatic.  Pulmonary:     Effort: Pulmonary effort is normal.  Neurological:     General: No focal deficit present.     Mental Status: He is oriented to person, place, and time.   Review of Systems  Respiratory:  Negative for shortness of breath.   Cardiovascular:  Negative for chest pain.  Gastrointestinal:  Negative for abdominal pain, constipation, diarrhea, heartburn, nausea and vomiting.  Neurological:  Negative for headaches.  Blood pressure 125/89, pulse 94, temperature 97.7 F (36.5 C), temperature source Oral, resp. rate 16, height 5\' 9"  (1.753 m), weight 109.3 kg, SpO2 100 %. Body mass index is 35.59 kg/m.   Social History   Tobacco Use  Smoking Status Every Day   Types: E-cigarettes  Smokeless Tobacco Never   Tobacco Cessation:  N/A, patient does not currently use tobacco products   Blood Alcohol level:  Lab Results  Component Value Date   ETH 157 (H) 03/31/2021   ETH <10 06/13/2017    Metabolic Disorder Labs:  Lab Results  Component Value Date   HGBA1C 4.9 04/04/2021   MPG 93.93 04/04/2021   MPG 103  07/10/2020   Lab Results  Component Value Date   PROLACTIN 36.8 (H) 06/14/2017   Lab Results  Component Value Date   CHOL 155 04/04/2021   TRIG 70 04/04/2021   HDL 36 (L) 04/04/2021   CHOLHDL 4.3 04/04/2021   VLDL 14 04/04/2021   LDLCALC 105 (H) 04/04/2021   LDLCALC 104 (H) 07/10/2020    See Psychiatric Specialty Exam and Suicide Risk Assessment completed by Attending Physician prior to discharge.  Discharge destination:  Home  Is patient on multiple antipsychotic therapies at  discharge:  No   Has Patient had three or more failed trials of antipsychotic monotherapy by history:  No  Recommended Plan for Multiple Antipsychotic Therapies: NA   Allergies as of 04/05/2021       Reactions   Peanut Oil Anaphylaxis   Peanut-containing Drug Products Anaphylaxis        Medication List     STOP taking these medications    cloNIDine 0.1 MG tablet Commonly known as: CATAPRES       TAKE these medications      Indication  B-D 3CC LUER-LOK SYR 25GX1/2" 25G X 1-1/2" 3 ML Misc Generic drug: SYRINGE-NEEDLE (DISP) 3 ML USE FOR TESTOSTERONE INJECTION ONCE A WEEK  Indication: testosterone injection   SYRINGE 3CC/25GX5/8" 25G X 5/8" 3 ML Misc Use once a week for testosterone injections  Indication: testosterone injection   BD Hypodermic Needle 25G X 1-1/2" Misc Generic drug: NEEDLE (DISP) 25 G USE FOR TESTOSTERONE INJECTION ONCE A WEEK  Indication: testosterone injection   lithium carbonate 300 MG CR tablet Commonly known as: LITHOBID Take 900 mg by mouth at bedtime.  Indication: Manic-Depression   prazosin 1 MG capsule Commonly known as: MINIPRESS Take 1 mg by mouth daily as needed.  Indication: Frightening Dreams   testosterone cypionate 200 MG/ML injection Commonly known as: DEPOTESTOSTERONE CYPIONATE Inject 100 mg (0.5 ml) into the subcutaneous tissue once weekly What changed:  how much to take how to take this when to take this  Indication: Person Born Male, Identifies as Male   traZODone 50 MG tablet Commonly known as: DESYREL Take 1 tablet (50 mg total) by mouth at bedtime as needed for sleep.  Indication: Trouble Sleeping   Vitamin D-3 125 MCG (5000 UT) Tabs Take 1 tablet by mouth daily. What changed: Another medication with the same name was removed. Continue taking this medication, and follow the directions you see here.  Indication: Vitamin D Deficiency        Follow-up Information     [facility]  Follow up on 04/10/2021.   Why: You are scheduled for therapy on 04/10/21.  This is a regularly scheduled appointment at the same time each week.  The next appointment after this one will be on 04/17/21. Contact information: Address: [address], [place], [place] [address]  Phone: [phone] Fax [phone]        [facility] Follow up on 04/15/2021.   Why: You are scheduled for medication management on 04/15/21 at 11:00am.  This appointment will be held in-person. Contact information: [address] [place] [address] [phone]         [facility] Follow up.   Why: You may go to this provider for substance abuse intensive outpatient therapy services during walk in hours:  Monday through Friday, from 6:00 am to 10:30 am. Contact information: [address] [place], [place] [address]  P:  [phone] F:  [phone]  Follow-up recommendations:   Activity:  as tolerated Diet:  heart healthy   Comments:  Prescriptions were given at discharge.  Patient is agreeable with the discharge plan.  Patient was given an opportunity to ask questions.  Patient appears to feel comfortable with discharge and denies any current suicidal or homicidal thoughts.    Patient is instructed prior to discharge to: Take all medications as prescribed by mental healthcare provider. Report any adverse effects and or reactions from the medicines to outpatient provider promptly. In the event of worsening symptoms, patient is instructed to call the crisis hotline, 911 and or go to the nearest ED for appropriate evaluation and treatment of symptoms. Patient is to follow-up with primary care provider for other medical issues, concerns and or health care needs.   Signed: Park Pope, MD 04/07/2021, 9:35 AM

## 2021-04-05 NOTE — BHH Suicide Risk Assessment (Signed)
[facility] Discharge Suicide Risk Assessment   Principal Problem: Bipolar 1 disorder, depressed, severe (HCC) Discharge Diagnoses: Principal Problem:   Bipolar 1 disorder, depressed, severe (HCC) Active Problems:   PTSD (post-traumatic stress disorder)   Substance use disorder   Total Time spent with patient: 20 minutes  Musculoskeletal: Strength & Muscle Tone: within normal limits Gait & Station: normal Patient leans: N/A  Psychiatric Specialty Exam  Presentation  General Appearance: Casual; Appropriate for Environment  Eye Contact:Minimal  Speech:Clear and Coherent  Speech Volume:Normal  Handedness:Right   Mood and Affect  Mood:Euthymic  Duration of Depression Symptoms: Greater than two weeks  Affect:Blunt   Thought Process  Thought Processes:Coherent; Goal Directed; Linear  Descriptions of Associations:Intact  Orientation:Full (Time, Place and Person)  Thought Content:Logical  History of Schizophrenia/Schizoaffective disorder:No  Duration of Psychotic Symptoms:No data recorded Hallucinations:Hallucinations: None  Ideas of Reference:None  Suicidal Thoughts:Suicidal Thoughts: No  Homicidal Thoughts:Homicidal Thoughts: No   Sensorium  Memory:Immediate Fair; Recent Fair; Remote Fair  Judgment:Fair  Insight:Fair   Executive Functions  Concentration:Fair  Attention Span:Fair  Recall:Fair  Fund of Knowledge:Fair  Language:Fair   Psychomotor Activity  Psychomotor Activity:Psychomotor Activity: Normal   Assets  Assets:Desire for Improvement; Resilience   Sleep  Sleep:Sleep: Good Number of Hours of Sleep: 6.5   Physical Exam: Physical Exam Vitals and nursing note reviewed.  Constitutional:      Appearance: Normal appearance.  HENT:     Head: Normocephalic and atraumatic.  Eyes:     Extraocular Movements: Extraocular movements intact.  Pulmonary:     Effort: Pulmonary effort is normal.  Musculoskeletal:        General: Normal  range of motion.     Cervical back: Normal range of motion.  Neurological:     General: No focal deficit present.     Mental Status: He is alert and oriented to person, place, and time.  Psychiatric:        Mood and Affect: Mood normal.        Behavior: Behavior normal.        Thought Content: Thought content normal.        Judgment: Judgment normal.   Review of Systems  Constitutional:  Negative for fever.  Respiratory:  Negative for cough.   Cardiovascular:  Negative for chest pain.  Gastrointestinal:  Negative for nausea and vomiting.  Musculoskeletal:  Negative for myalgias.  Skin: Negative.   Neurological:  Positive for headaches.  Psychiatric/Behavioral:  Negative for hallucinations and suicidal ideas. The patient is nervous/anxious and has insomnia.   Blood pressure 125/89, pulse 94, temperature 97.7 F (36.5 C), temperature source Oral, resp. rate 16, height 5\' 9"  (1.753 m), weight 109.3 kg, SpO2 100 %. Body mass index is 35.59 kg/m.  Mental Status Per Nursing Assessment::   On Admission:  Suicide plan  Demographic Factors:  Male, Adolescent or young adult, Caucasian, Low socioeconomic status, and Unemployed  Loss Factors: Decrease in vocational status and Financial problems/change in socioeconomic status  Historical Factors: Prior suicide attempts and Family history of mental illness or substance abuse  Risk Reduction Factors:   Living with another person, especially a relative and Positive therapeutic relationship  Continued Clinical Symptoms:  Bipolar Disorder:   Depressive phase Alcohol/Substance Abuse/Dependencies  Cognitive Features That Contribute To Risk:  Polarized thinking    Suicide Risk:  Mild:  Suicidal ideation of limited frequency, intensity, duration, and specificity.  There are no identifiable plans, no associated intent, mild dysphoria and related symptoms, good self-control (both  objective and subjective assessment), few other risk factors,  and identifiable protective factors, including available and accessible social support.   Follow-up Information     [facility] Follow up on 04/10/2021.   Why: You are scheduled for therapy on 04/10/21.  This is a regularly scheduled appointment at the same time each week.  The next appointment after this one will be on 04/17/21. Contact information: Address: [address], [place], [place] [address]  Phone: [phone] Fax [phone]        [facility] Follow up on 04/15/2021.   Why: You are scheduled for medication management on 04/15/21 at 11:00am.  This appointment will be held in-person. Contact information: [address] [place] [address] [phone]         [facility] Follow up.   Why: You may go to this provider for substance abuse intensive outpatient therapy services during walk in hours:  Monday through Friday, from 6:00 am to 10:30 am. Contact information: [address] [place], [place] [address]  P:  [phone] F:  [phone]                Plan Of Care/Follow-up recommendations:  Activity:  ad lib Diet:  cardiac Prescriptions for new medications provided for the patient to bridge to follow up appointment. The patient was informed that refills for these prescriptions are generally not provided, and patient is encouraged to attend all follow up appointments to address medication refills and adjustments.   Today's discharge was reviewed with treatment team, and the team is in agreement that the patient is ready for discharge. The patient is was of the discharge plan for today and has been given opportunity to ask questions. At time of discharge, the patient does not vocalize any acute harm to self or others, is goal directed, able to advocate for self and organizational baseline.   At discharge, the patient is instructed to:  Take all medications as prescribed. Report any adverse effects and or reactions from the  medicines to her outpatient provider promptly.  Do not engage in alcohol and/or illegal drug use while on prescription medicines.  In the event of worsening symptoms, patient is instructed to call the crisis hotline, 911 and or go to the nearest ED for appropriate evaluation and treatment of symptoms.  Follow-up with primary care provider for further care of medical issues, concerns and or health care needs.   Roselle Locus, MD 04/05/2021, 10:35 AM

## 2021-04-05 NOTE — Group Note (Signed)
Recreation Therapy Group Note   Group Topic:Stress Management  Group Date: 04/05/2021 Start Time: 0930 End Time: 0945 Facilitators: Caroll Rancher, LRT/CTRS Location: [address] Dayroom  Goal Area(s) Addresses:  Patient will actively participate in stress management techniques presented during session.  Patient will successfully identify benefit of practicing stress management post d/c.   Group Description: Meditation.  LRT played a meditation that focused on looking at each day as a new beginning to try new things, make a new friend, take time for yourself or make peace with any individual you may have an issue with.  Patients were to listen and follow along as meditation played to engage in activity.   Affect/Mood: N/A   Participation Level: Did not attend    Clinical Observations/Individualized Feedback: Pt did not attend group.    Plan: Continue to engage patient in RT group sessions 2-3x/week.   Caroll Rancher, LRT/CTRS 04/05/2021 12:12 PM

## 2021-04-08 ENCOUNTER — Telehealth: Payer: Self-pay | Admitting: Licensed Clinical Social Worker

## 2021-04-08 NOTE — Telephone Encounter (Signed)
Spoke with patient to schedule follow up. Patient reported feeling "okay". Plans to move in order to have more social supports and to support mental health. BH F/U 10/26 at 3 pm.

## 2021-04-10 ENCOUNTER — Ambulatory Visit (INDEPENDENT_AMBULATORY_CARE_PROVIDER_SITE_OTHER): Payer: Medicaid Other | Admitting: Licensed Clinical Social Worker

## 2021-04-10 ENCOUNTER — Other Ambulatory Visit: Payer: Self-pay

## 2021-04-10 DIAGNOSIS — F4310 Post-traumatic stress disorder, unspecified: Secondary | ICD-10-CM

## 2021-04-10 NOTE — BH Specialist Note (Signed)
Integrated Behavioral Health Follow Up In-Person Visit  MRN: [id number] Name: Jesse Luna  Number of Integrated Behavioral Health Clinician visits: 2/6 Session Start time: 3:04 PM  Session End time: 4:04 PM Total time: 60 minutes  Types of Service: General Behavioral Integrated Care (BHI)  Interpretor:No. Interpretor Name and Language: n/a  Subjective: Jesse Luna is a 20 y.o. adult accompanied by  self. Patient was referred by Candida Peeling FNP for suicidal ideation. Patient reports the following symptoms/concerns: continued depressive symptoms, adjustments following hospitalization Duration of problem: weeks; Severity of problem: moderate  Objective: Mood: Depressed and Affect: Appropriate Risk of harm to self or others: No plan to harm self or others, reported feeling able to keep self safe and to reach out for help if needed   Life Context: Family and Social: Planning to move from apartment, wants to travel  School/Work: doing contracting work  Self-Care: reconnected with therapist, plans to start journaling, making plans to travel, avoiding substances, helping friend with fish tank Life Changes: Recent hospitalization, foster family no longer speaking to patient, patient reported finding out he has a heart murmur while in the hospital   Patient and/or Family's Strengths/Protective Factors: Social and Emotional competence and takes medications daily as prescribed, resilience, regular follow up with adolescent medicine team     Goals Addressed: Patient will: Reduce symptoms of: anxiety, depression, and suicidal ideation Increase knowledge and/or ability of: coping skills  Demonstrate ability to: Increase adequate support systems for patient/family   Progress towards Goals: Ongoing  Interventions: Interventions utilized:  Motivational Interviewing, Solution-Focused Strategies, Mindfulness or Management consultant, Psychoeducation and/or Health Education, and  Supportive Reflection Standardized Assessments completed: Not Needed  Patient and/or Family Response: Patient worked to process recent hospitalization and reasons for seeking help. Patient showed improvements in mood and ability to plan for future, sharing that he would like to travel. Patient reported planning to continue to make social plans and stay busy to help with mood. Patient engaged in discussion of symptoms, such as feelings of panic when triggered, and was open to information on grounding strategies.   Patient Centered Plan: Patient is on the following Treatment Plan(s): PTSD  Assessment: Patient currently experiencing continued symptoms consistent with post traumatic stress- flashback and dissociative reactions, panic attacks, negative mood states.   Patient may benefit from continued medication management and ongoing counseling.  Plan: Follow up with behavioral health clinician on : No follow up scheduled as patient has reconnected with therapist  Behavioral recommendations: Consider trying other grounding techniques (hot/cold water, mints, comforting smells, grounding statements), Be open with psychiatrists about your experience on medications (the more honest you are the better they are able to help), continue to plan social engagements and things to look forward to  Referral(s):  None needed  "From scale of 1-10, how likely are you to follow plan?": Patient agreeable to above plan   Carleene Overlie, [facility]

## 2021-04-17 ENCOUNTER — Encounter: Payer: Self-pay | Admitting: Licensed Clinical Social Worker

## 2021-04-17 NOTE — BH Specialist Note (Signed)
PHQSADS entry from 03/28/2021 10/21/13

## 2021-10-15 ENCOUNTER — Encounter: Payer: Self-pay | Admitting: Pediatrics

## 2021-10-15 ENCOUNTER — Ambulatory Visit (INDEPENDENT_AMBULATORY_CARE_PROVIDER_SITE_OTHER): Payer: Medicaid Other | Admitting: Pediatrics

## 2021-10-15 VITALS — BP 127/89 | HR 73 | Ht 68.5 in | Wt 253.4 lb

## 2021-10-15 DIAGNOSIS — F4310 Post-traumatic stress disorder, unspecified: Secondary | ICD-10-CM | POA: Diagnosis not present

## 2021-10-15 DIAGNOSIS — F332 Major depressive disorder, recurrent severe without psychotic features: Secondary | ICD-10-CM

## 2021-10-15 DIAGNOSIS — F319 Bipolar disorder, unspecified: Secondary | ICD-10-CM

## 2021-10-15 DIAGNOSIS — Z113 Encounter for screening for infections with a predominantly sexual mode of transmission: Secondary | ICD-10-CM

## 2021-10-15 DIAGNOSIS — R030 Elevated blood-pressure reading, without diagnosis of hypertension: Secondary | ICD-10-CM

## 2021-10-15 DIAGNOSIS — F1990 Other psychoactive substance use, unspecified, uncomplicated: Secondary | ICD-10-CM

## 2021-10-15 DIAGNOSIS — E039 Hypothyroidism, unspecified: Secondary | ICD-10-CM

## 2021-10-15 DIAGNOSIS — Z789 Other specified health status: Secondary | ICD-10-CM

## 2021-10-15 MED ORDER — "BD LUER-LOK SYRINGE 25G X 1-1/2"" 3 ML MISC": 1 refills | Status: AC

## 2021-10-15 MED ORDER — "SYRINGE 25G X 5/8"" 3 ML MISC": 6 refills | Status: AC

## 2021-10-15 MED ORDER — TESTOSTERONE CYPIONATE 200 MG/ML IM SOLN: INTRAMUSCULAR | 4 refills | Status: AC

## 2021-10-15 MED ORDER — TESTOSTERONE CYPIONATE 200 MG/ML IM SOLN: INTRAMUSCULAR | 4 refills | Status: DC

## 2021-10-15 NOTE — Patient Instructions (Signed)
Continue T at same dose until we get labs back  ?Try and pick up new syringes and needles  ?No more cocaine!  ?

## 2021-10-15 NOTE — Progress Notes (Signed)
History was provided by the patient and partner [facility] . ? ?Jesse Luna is a 21 y.o. adult who is here for bipolar disorder, substance use disorder, PTSD, hypothyroidism, MDD, elevated BP.  ?Pcp, No  ? ?HPI:  Pt reports mood from day to day is "blah" and some days is "absolutely outrageous" with anger and screaming. Going back to psychiatry tomorrow. Not currently taking ay psych meds- was likely around the first of the year. Once he falls asleep can stay asleep but is pretty hard to fall asleep.  ? ?Denies any SI/HI--last was October. Stopped going to therapy in January.  ? ?Currently not working or going to school- jobs have been up and down.  ? ?Last took T last week- still doing IM once a week- 100 mg. 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  ? ?Using marijuana and occasionally cocaine very infrequently. Has a neighbor who is a Higher education careers adviser and she will pressure him to use with her. His partner who is with him today doesn't like that he uses.  ? ? ?  10/15/2021  ? 11:18 AM 04/17/2021  ?  2:33 PM 02/20/2021  ?  4:31 PM  ?PHQ-SADS Last 3 Score only  ?PHQ-15 Score 1 5 6   ?Total GAD-7 Score 0 8 7  ?PHQ Adolescent Score 1 15 14   ?  ? ? ?No LMP recorded. (Menstrual status: Other). ? ? ?Patient Active Problem List  ? Diagnosis Date Noted  ? Bipolar 1 disorder, depressed, severe (HCC) 04/01/2021  ? Bipolar 1 disorder (HCC) 03/31/2021  ? Suicidal ideation 03/28/2021  ? MDD (major depressive disorder), recurrent episode, moderate (HCC)   ? Substance use disorder 03/12/2021  ? Severe episode of recurrent major depressive disorder, without psychotic features (HCC) 03/12/2021  ? Weight loss of more than 10% body weight 03/12/2021  ? Elevated BP without diagnosis of hypertension 07/31/2020  ? History of peanut allergy 07/07/2017  ? Hypothyroidism 07/07/2017  ? 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 PTSD (post-traumatic stress disorder) 06/14/2017  ? ? ?Current Outpatient  Medications on File Prior to Visit  ?Medication Sig Dispense Refill  ? BD HYPODERMIC NEEDLE 25G X 1-1/2" MISC USE FOR TESTOSTERONE INJECTION ONCE A WEEK 90 each 11  ? SYRINGE-NEEDLE, DISP, 3 ML (B-D 3CC LUER-LOK SYR 25GX1/2") 25G X 1-1/2" 3 ML MISC USE FOR TESTOSTERONE INJECTION ONCE A WEEK 30 each 1  ? Syringe/Needle, Disp, (SYRINGE 3CC/25GX5/8") 25G X 5/8" 3 ML MISC Use once a week for testosterone injections 100 each 6  ? testosterone cypionate (DEPOTESTOSTERONE CYPIONATE) 200 MG/ML injection Inject 100 mg (0.5 ml) into the subcutaneous tissue once weekly (Patient taking differently: Inject 100 mg into the muscle once a week. Inject 100 mg (0.5 ml) into the subcutaneous tissue once weekly) 2 mL 4  ? Cholecalciferol (VITAMIN D-3) 125 MCG (5000 UT) TABS Take 1 tablet by mouth daily. (Patient not taking: Reported on 10/15/2021)    ? lithium carbonate (LITHOBID) 300 MG CR tablet Take 900 mg by mouth at bedtime.    ? prazosin (MINIPRESS) 1 MG capsule Take 1 mg by mouth daily as needed. (Patient not taking: Reported on 10/15/2021)    ? traZODone (DESYREL) 50 MG tablet Take 1 tablet (50 mg total) by mouth at bedtime as needed for sleep. 30 tablet 0  ? ?No current facility-administered medications on file prior to visit.  ? ? ?Allergies  ?Allergen Reactions  ? Peanut Oil Anaphylaxis  ? Peanut-Containing Drug Products Anaphylaxis  ? ? ?  Physical Exam:  ?  ?Vitals:  ? 10/15/21 1023  ?BP: 127/89  ?Pulse: 73  ?Weight: 253 lb 6.4 oz (114.9 kg)  ?Height: 5' 8.5" (1.74 m)  ? ? ?Growth percentile SmartLinks can only be used for patients less than 55 years old. ? ?Physical Exam ?Vitals reviewed.  ?Constitutional:   ?   Appearance: He is well-developed.  ?HENT:  ?   Head: Normocephalic.  ?Neck:  ?   Thyroid: No thyromegaly.  ?Cardiovascular:  ?   Rate and Rhythm: Bradycardia present. Rhythm regularly irregular.  ?   Heart sounds: Normal heart sounds.  ?Pulmonary:  ?   Effort: Pulmonary effort is normal.  ?   Breath sounds: Normal  breath sounds.  ?Abdominal:  ?   General: Bowel sounds are normal.  ?   Palpations: Abdomen is soft.  ?Musculoskeletal:     ?   General: Normal range of motion.  ?Lymphadenopathy:  ?   Cervical: No cervical adenopathy.  ?Skin: ?   General: Skin is warm and dry.  ?   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   Mental Status: He is alert and oriented to person, place, and time.  ?Psychiatric:     ?   Mood and Affect: Mood and affect normal.  ? ? ?Assessment/Plan: ?1. Bipolar 1 disorder (HCC) ?Getting back with psychiatry and therapist starting tomorrow. Moods have been up and down but overall fairly stable without SI or HI.  ? ?2. PTSD (post-traumatic stress disorder) ?Stable  ? ?3. Severe episode of recurrent major depressive disorder, without psychotic features (HCC) ?As above.  ?- CBC with Differential/Platelet ? ?4. Substance use disorder ?Continues to use substances. Most concerning is cocaine. We discussed risks of this including worsening of mood disorder and death related to fentanyl. He is aware and reports desire to be more careful- partner would like him to stop.  ?- CBC with Differential/Platelet ? ?5. Male-to-male transgender person ?Labs today for hormones- rx sent. Some needles given here and sent rx for additional. Discussed SQ vs. IM and injection techniques as he has been doing IM in the medial vs. Lateral thigh. Discussed can do SQ even with longer needles- just don't insert as far.  ? ?6. Hypothyroidism, unspecified type ?Repeat today- had TSH of 9 in October on hospitalization that was not treated. Discussed can impact mood. Will restart synthroid if high again. Relative bradycardia on exam.  ?- CBC with Differential/Platelet ?- TSH ?- T4, free ? ?7. Elevated BP without diagnosis of hypertension ?Slightly elevated for adult norms- will continue to monitor.  ? ?8. Routine screening for STI (sexually transmitted infection) ?Per protocol.  ?- C. trachomatis/N. gonorrhoeae RNA ? ?Return in  3 months or sooner as needed  ? ?[name], FNP  ? ? ?

## 2021-10-16 ENCOUNTER — Other Ambulatory Visit: Payer: Self-pay | Admitting: Pediatrics

## 2021-10-16 LAB — C. TRACHOMATIS/N. GONORRHOEAE RNA
C. trachomatis RNA, TMA: NOT DETECTED
N. gonorrhoeae RNA, TMA: NOT DETECTED

## 2021-10-16 MED ORDER — LEVOTHYROXINE SODIUM 50 MCG PO TABS: 50.0000 ug | ORAL_TABLET | Freq: Every day | ORAL | 1 refills | Status: AC

## 2021-10-22 LAB — CBC WITH DIFFERENTIAL/PLATELET
Absolute Monocytes: 585 cells/uL (ref 200–950)
Basophils Absolute: 89 cells/uL (ref 0–200)
Basophils Relative: 1.2 %
Eosinophils Absolute: 696 cells/uL — ABNORMAL HIGH (ref 15–500)
Eosinophils Relative: 9.4 %
HCT: 49.8 % (ref 38.5–50.0)
Hemoglobin: 16.9 g/dL (ref 13.2–17.1)
Lymphs Abs: 2146 cells/uL (ref 850–3900)
MCH: 30.7 pg (ref 27.0–33.0)
MCHC: 33.9 g/dL (ref 32.0–36.0)
MCV: 90.5 fL (ref 80.0–100.0)
MPV: 11.2 fL (ref 7.5–12.5)
Monocytes Relative: 7.9 %
Neutro Abs: 3885 cells/uL (ref 1500–7800)
Neutrophils Relative %: 52.5 %
Platelets: 270 10*3/uL (ref 140–400)
RBC: 5.5 10*6/uL (ref 4.20–5.80)
RDW: 12.3 % (ref 11.0–15.0)
Total Lymphocyte: 29 %
WBC: 7.4 10*3/uL (ref 3.8–10.8)

## 2021-10-22 LAB — TESTOS,TOTAL,FREE AND SHBG (FEMALE)
Free Testosterone: 82.1 pg/mL (ref 35.0–155.0)
Sex Hormone Binding: 30 nmol/L (ref 10–50)
Testosterone, Total, LC-MS-MS: 382 ng/dL (ref 250–1100)

## 2021-10-22 LAB — TSH: TSH: 5.53 mIU/L — ABNORMAL HIGH (ref 0.40–4.50)

## 2021-10-22 LAB — T4, FREE: Free T4: 1.2 ng/dL (ref 0.8–1.8)

## 2021-12-09 ENCOUNTER — Other Ambulatory Visit: Payer: Self-pay | Admitting: Pediatrics

## 2022-01-16 ENCOUNTER — Ambulatory Visit: Payer: Medicaid Other | Admitting: Pediatrics
